# Patient Record
Sex: Female | Born: 1940
Health system: Southern US, Community
[De-identification: ages and names within clinical notes are randomized; demographics above are authoritative.]

## PROBLEM LIST (undated history)

## (undated) DIAGNOSIS — E876 Hypokalemia: Secondary | ICD-10-CM

## (undated) DIAGNOSIS — E785 Hyperlipidemia, unspecified: Secondary | ICD-10-CM

## (undated) DIAGNOSIS — M810 Age-related osteoporosis without current pathological fracture: Secondary | ICD-10-CM

## (undated) DIAGNOSIS — E039 Hypothyroidism, unspecified: Secondary | ICD-10-CM

## (undated) DIAGNOSIS — K279 Peptic ulcer, site unspecified, unspecified as acute or chronic, without hemorrhage or perforation: Secondary | ICD-10-CM

## (undated) DIAGNOSIS — J449 Chronic obstructive pulmonary disease, unspecified: Secondary | ICD-10-CM

## (undated) DIAGNOSIS — K219 Gastro-esophageal reflux disease without esophagitis: Secondary | ICD-10-CM

## (undated) DIAGNOSIS — J309 Allergic rhinitis, unspecified: Secondary | ICD-10-CM

## (undated) DIAGNOSIS — C50919 Malignant neoplasm of unspecified site of unspecified female breast: Secondary | ICD-10-CM

## (undated) DIAGNOSIS — I10 Essential (primary) hypertension: Secondary | ICD-10-CM

## (undated) DIAGNOSIS — I739 Peripheral vascular disease, unspecified: Secondary | ICD-10-CM

## (undated) HISTORY — DX: Essential (primary) hypertension: I10

## (undated) HISTORY — PX: ROTATOR CUFF REPAIR: SHX139

## (undated) HISTORY — DX: Hyperlipidemia, unspecified: E78.5

## (undated) HISTORY — DX: Hypokalemia: E87.6

## (undated) HISTORY — PX: EYE SURGERY: SHX253

## (undated) HISTORY — DX: Allergic rhinitis, unspecified: J30.9

## (undated) HISTORY — DX: Hypothyroidism, unspecified: E03.9

## (undated) HISTORY — PX: OTHER SURGICAL HISTORY: SHX169

## (undated) HISTORY — DX: Peripheral vascular disease, unspecified: I73.9

## (undated) HISTORY — PX: BREAST LUMPECTOMY: SHX2

## (undated) HISTORY — DX: Peptic ulcer, site unspecified, unspecified as acute or chronic, without hemorrhage or perforation: K27.9

## (undated) HISTORY — DX: Age-related osteoporosis without current pathological fracture: M81.0

## (undated) HISTORY — DX: Gastro-esophageal reflux disease without esophagitis: K21.9

## (undated) HISTORY — DX: Chronic obstructive pulmonary disease, unspecified: J44.9

## (undated) HISTORY — DX: Malignant neoplasm of unspecified site of unspecified female breast: C50.919

---

## 2012-02-19 ENCOUNTER — Encounter: Payer: Self-pay | Admitting: Critical Care Medicine

## 2012-02-20 ENCOUNTER — Ambulatory Visit (INDEPENDENT_AMBULATORY_CARE_PROVIDER_SITE_OTHER): Payer: Medicare FFS | Admitting: Critical Care Medicine

## 2012-02-20 ENCOUNTER — Encounter: Payer: Self-pay | Admitting: Critical Care Medicine

## 2012-02-20 VITALS — BP 102/58 | HR 78 | Temp 100.1°F | Ht 64.0 in | Wt 117.0 lb

## 2012-02-20 DIAGNOSIS — K219 Gastro-esophageal reflux disease without esophagitis: Secondary | ICD-10-CM | POA: Insufficient documentation

## 2012-02-20 DIAGNOSIS — M810 Age-related osteoporosis without current pathological fracture: Secondary | ICD-10-CM | POA: Insufficient documentation

## 2012-02-20 DIAGNOSIS — C50919 Malignant neoplasm of unspecified site of unspecified female breast: Secondary | ICD-10-CM | POA: Insufficient documentation

## 2012-02-20 DIAGNOSIS — K279 Peptic ulcer, site unspecified, unspecified as acute or chronic, without hemorrhage or perforation: Secondary | ICD-10-CM | POA: Insufficient documentation

## 2012-02-20 DIAGNOSIS — C3490 Malignant neoplasm of unspecified part of unspecified bronchus or lung: Secondary | ICD-10-CM | POA: Insufficient documentation

## 2012-02-20 DIAGNOSIS — R918 Other nonspecific abnormal finding of lung field: Secondary | ICD-10-CM

## 2012-02-20 DIAGNOSIS — R222 Localized swelling, mass and lump, trunk: Secondary | ICD-10-CM

## 2012-02-20 DIAGNOSIS — I739 Peripheral vascular disease, unspecified: Secondary | ICD-10-CM | POA: Insufficient documentation

## 2012-02-20 DIAGNOSIS — I10 Essential (primary) hypertension: Secondary | ICD-10-CM | POA: Insufficient documentation

## 2012-02-20 DIAGNOSIS — J449 Chronic obstructive pulmonary disease, unspecified: Secondary | ICD-10-CM | POA: Insufficient documentation

## 2012-02-20 MED ORDER — TRAMADOL HCL 50 MG PO TABS: ORAL_TABLET | ORAL | Status: DC

## 2012-02-20 NOTE — Progress Notes (Signed)
Subjective:    Patient ID: Molly Middleton, female    DOB: June 29, 1940, 71 y.o.   MRN: [id number]  HPI 71 y.o. WF. This patient is referred for evaluation a right middle lobe lung mass. Patient has noted weight loss loss of appetite and aching all over. Symptoms a progress and she underwent imaging studies showing a right middle lobe lung mass with mediastinal adenopathy. The patient referred for further evaluation. There is no fever. No chills. There is no hemoptysis. Patient's lifelong smoker. She's been diagnosis COPD in the past. Patient referred for further evaluation.  Past Medical History  Diagnosis Date  . Hyperlipemia   . Hypothyroid   . COPD (chronic obstructive pulmonary disease)   . GERD (gastroesophageal reflux disease)   . Peptic ulcer disease   . Hypertension   . Osteoporosis   . Allergic rhinitis   . PVD (peripheral vascular disease)   . Hypokalemia   . Breast cancer      Family History  Problem Relation Age of Onset  . Heart disease Father   . Stroke Mother      History   Social History  . Marital Status: Widowed    Spouse Name: N/A    Number of Children: N/A  . Years of Education: N/A   Occupational History  . Not on file.   Social History Main Topics  . Smoking status: Current Every Day Smoker    Types: Cigarettes  . Smokeless tobacco: Not on file     Comment: Currently smoking 2-3 cig per day.  . Alcohol Use: Not on file  . Drug Use: Not on file  . Sexually Active: Not on file   Other Topics Concern  . Not on file   Social History Narrative  . No narrative on file     Allergies  Allergen Reactions  . Advil (Ibuprofen)   . Asa (Aspirin)      Outpatient Prescriptions Prior to Visit  Medication Sig Dispense Refill  . albuterol (PROVENTIL HFA;VENTOLIN HFA) 108 (90 BASE) MCG/ACT inhaler Inhale 2 puffs into the lungs every 6 (six) hours as needed.      Marland Kitchen amcinonide (CYCLOCORT) 0.1 % ointment Apply topically 2 (two) times daily.      Marland Kitchen  aspirin EC 81 MG tablet Take 81 mg by mouth daily.      Marland Kitchen atenolol-chlorthalidone (TENORETIC) 50-25 MG per tablet Take 1 tablet by mouth daily.      . Calcium Carbonate (CALTRATE 600 PO) Take 1 tablet by mouth 2 (two) times daily.       . diphenhydramine-acetaminophen (TYLENOL PM) 25-500 MG TABS Take 1 tablet by mouth at bedtime.       . mometasone (NASONEX) 50 MCG/ACT nasal spray Place 2 sprays into the nose daily.      . niacin (NIASPAN) 1000 MG CR tablet Take 1,000 mg by mouth at bedtime.      Marland Kitchen omeprazole (PRILOSEC) 20 MG capsule Take 20 mg by mouth daily.      . raloxifene (EVISTA) 60 MG tablet Take 60 mg by mouth daily.      Marland Kitchen tiotropium (SPIRIVA) 18 MCG inhalation capsule Place 18 mcg into inhaler and inhale daily.      . [DISCONTINUED] potassium chloride (K-DUR,KLOR-CON) 10 MEQ tablet Take 10 mEq by mouth 2 (two) times daily.      . [DISCONTINUED] levothyroxine (SYNTHROID, LEVOTHROID) 75 MCG tablet Take 75 mcg by mouth daily.      . [DISCONTINUED] levothyroxine (SYNTHROID, LEVOTHROID)  88 MCG tablet Take 88 mcg by mouth daily.      . [DISCONTINUED] mupirocin ointment (BACTROBAN) 2 % Apply topically 3 (three) times daily as needed.       . [DISCONTINUED] simvastatin (ZOCOR) 80 MG tablet Take 80 mg by mouth at bedtime.       Last reviewed on 02/20/2012  3:56 PM by Storm Frisk, MD    Review of Systems Constitutional:   +++  weight loss,+++ night sweats, ++ Fevers, chills,++ fatigue, ++lassitude. HEENT:   No headaches,  Difficulty swallowing,  Tooth/dental problems,  Sore throat,                No sneezing, itching, ear ache, nasal congestion, post nasal drip,   CV:  +++ chest pain,  Orthopnea, PND, ++ swelling in lower extremities, no anasarca, dizziness, palpitations  GI  No heartburn, indigestion, abdominal pain, nausea, vomiting, diarrhea, change in bowel habits, loss of appetite  Resp: ++ shortness of breath with exertion ++ at rest.  No excess mucus, ++ productive cough,  ++  non-productive cough,  No coughing up of blood.  No change in color of mucus.  No wheezing.  No chest wall deformity  Skin: no rash or lesions.  GU: no dysuria, change in color of urine, no urgency or frequency.  No flank pain.  MS:  ++ joint pain no swelling.  No decreased range of motion.  No back pain.  Psych:  No change in mood or affect. No depression or anxiety.  No memory loss.     Objective:   Physical Exam Filed Vitals:   02/20/12 1541  BP: 102/58  Pulse: 78  Temp: 100.1 F (37.8 C)  Height: 5\' 4"  (1.626 m)  Weight: 117 lb (53.071 kg)  SpO2: 98%    Gen: thin cachectic WF, in no distress,  normal affect  ENT: No lesions,  mouth clear,  oropharynx clear, no postnasal drip  Neck: No JVD, no TMG, no carotid bruits  Lungs: No use of accessory muscles, no dullness to percussion,distant BS  Cardiovascular: RRR, heart sounds normal, no murmur or gallops, no peripheral edema  Abdomen: soft and NT, no HSM,  BS normal  Musculoskeletal: No deformities, no cyanosis or clubbing  Neuro: alert, non focal  Skin: Warm, no lesions or rashes    CT scan of the chest is reviewed and shows a 7 cm mass in the right middle lobe with associated mediastinal and hilar adenopathy      Assessment & Plan:   Lung mass Right middle lobe lung mass 7 cm in diameter with associated hilar and mediastinal lymphadenopathy. Diffuse bone pain therefore need to rule out bone metastases. This is malignancy until proven otherwise. Plan Needle biopsy will be obtained of the lung mass PET scan will be obtained to evaluate metastatic disease Oncology referral will be made to the [facility] cancer clinic after results are obtained from needle biopsy   Updated Medication List Outpatient Encounter Prescriptions as of 02/20/2012  Medication Sig Dispense Refill  . albuterol (PROVENTIL HFA;VENTOLIN HFA) 108 (90 BASE) MCG/ACT inhaler Inhale 2 puffs into the lungs every 6 (six) hours as needed.      Marland Kitchen  amcinonide (CYCLOCORT) 0.1 % ointment Apply topically 2 (two) times daily.      Marland Kitchen aspirin EC 81 MG tablet Take 81 mg by mouth daily.      Marland Kitchen atenolol-chlorthalidone (TENORETIC) 50-25 MG per tablet Take 1 tablet by mouth daily.      Marland Kitchen  Calcium Carbonate (CALTRATE 600 PO) Take 1 tablet by mouth 2 (two) times daily.       . cyanocobalamin 100 MCG tablet Take 100 mcg by mouth daily.      . diphenhydramine-acetaminophen (TYLENOL PM) 25-500 MG TABS Take 1 tablet by mouth at bedtime.       Marland Kitchen levofloxacin (LEVAQUIN) 750 MG tablet Take 1 tablet by mouth daily.      Marland Kitchen levothyroxine (SYNTHROID, LEVOTHROID) 25 MCG tablet Take by mouth daily. UNSURE OF STRENGTH      . mometasone (NASONEX) 50 MCG/ACT nasal spray Place 2 sprays into the nose daily.      . niacin (NIASPAN) 1000 MG CR tablet Take 1,000 mg by mouth at bedtime.      Marland Kitchen omeprazole (PRILOSEC) 20 MG capsule Take 20 mg by mouth daily.      . potassium chloride (MICRO-K) 10 MEQ CR capsule Take 1 tablet by mouth Twice daily.      . raloxifene (EVISTA) 60 MG tablet Take 60 mg by mouth daily.      Marland Kitchen tiotropium (SPIRIVA) 18 MCG inhalation capsule Place 18 mcg into inhaler and inhale daily.      . [DISCONTINUED] potassium chloride (K-DUR,KLOR-CON) 10 MEQ tablet Take 10 mEq by mouth 2 (two) times daily.      . traMADol (ULTRAM) 50 MG tablet 1-2 every 4 hours as needed for pain  60 tablet  4  . [DISCONTINUED] levothyroxine (SYNTHROID, LEVOTHROID) 75 MCG tablet Take 75 mcg by mouth daily.      . [DISCONTINUED] levothyroxine (SYNTHROID, LEVOTHROID) 88 MCG tablet Take 88 mcg by mouth daily.      . [DISCONTINUED] mupirocin ointment (BACTROBAN) 2 % Apply topically 3 (three) times daily as needed.       . [DISCONTINUED] simvastatin (ZOCOR) 80 MG tablet Take 80 mg by mouth at bedtime.

## 2012-02-20 NOTE — Patient Instructions (Addendum)
A needle biopsy of the lung will be obtained A PET scan will be obtained I will call with results Lab today to check on coagulation status for the biopsy Tramadol for pain

## 2012-02-21 ENCOUNTER — Telehealth: Payer: Self-pay | Admitting: Critical Care Medicine

## 2012-02-21 LAB — PROTIME-INR: INR: 1.38 (ref <1.50)

## 2012-02-21 NOTE — Assessment & Plan Note (Signed)
Right middle lobe lung mass 7 cm in diameter with associated hilar and mediastinal lymphadenopathy. Diffuse bone pain therefore need to rule out bone metastases. This is malignancy until proven otherwise. Plan Needle biopsy will be obtained of the lung mass PET scan will be obtained to evaluate metastatic disease Oncology referral will be made to the [facility] cancer clinic after results are obtained from needle biopsy

## 2012-02-21 NOTE — Telephone Encounter (Signed)
Called and spoke with pts daughter and she stated she had a few questions for PW.  She has questions about the biopsy and the PET scan and she has declined in the last couple of weeks---her legs are hurting so bad she can hardly walk and not able to get up and move around much.  Would like to speak with PW if at all possible.  Thanks  Allergies  Allergen Reactions  . Advil (Ibuprofen)   . Asa (Aspirin)

## 2012-02-21 NOTE — Telephone Encounter (Signed)
No answer when I call back

## 2012-02-24 ENCOUNTER — Encounter (HOSPITAL_COMMUNITY): Payer: Self-pay | Admitting: Pharmacy Technician

## 2012-02-24 NOTE — Telephone Encounter (Signed)
The pt;s daughter would still like to speak with PW if possible. She wants to know 1) if the PET scan shows cancer throughout the pt's body, will the other procedures still be necessary and 2) if needle biopsy is done will this spread the cancer that is there? Pls advise.

## 2012-02-24 NOTE — Telephone Encounter (Signed)
i answered her questions.

## 2012-02-25 ENCOUNTER — Other Ambulatory Visit: Payer: Self-pay | Admitting: Radiology

## 2012-02-26 ENCOUNTER — Encounter (HOSPITAL_COMMUNITY)
Admission: RE | Admit: 2012-02-26 | Discharge: 2012-02-26 | Disposition: A | Discharge status: Home / Self Care | Payer: Medicare FFS | Source: Ambulatory Visit | Attending: Critical Care Medicine | Admitting: Critical Care Medicine

## 2012-02-26 ENCOUNTER — Telehealth: Payer: Self-pay | Admitting: Critical Care Medicine

## 2012-02-26 DIAGNOSIS — R222 Localized swelling, mass and lump, trunk: Secondary | ICD-10-CM | POA: Insufficient documentation

## 2012-02-26 DIAGNOSIS — R918 Other nonspecific abnormal finding of lung field: Secondary | ICD-10-CM

## 2012-02-26 LAB — GLUCOSE, CAPILLARY: Glucose-Capillary: 120 mg/dL — ABNORMAL HIGH (ref 70–99)

## 2012-02-26 NOTE — Telephone Encounter (Signed)
I spoke with the pt daughter and she states the PET scan machine broke today so they did not have the PET scan done, it was rescheduled to 03/03/12. She also states the pt is saying that the biopsy set for tomorrow is cancelled as well. They wants [place] to check on this. Also they want to know if there is anyway to have the PET scan sooner then 03-03-12. They want to go to another location if [facility] does not have a sooner appt. I called [facility] radiology and they states the machine is broken for PET scan. They state that the biopsy is still scheduled for 02-27-12. Dr. Delford Field did you need the PET scan to be done before the biopsy? If not is it ok to schedule the PET at another location in order for the pt to have it done sooner? Please advise. Carron Curie, CMA

## 2012-02-26 NOTE — Telephone Encounter (Signed)
Pt's daughter called back again re: same. Would like to hear from nurse asap in the next 30 minutes. Hazel Sams

## 2012-02-26 NOTE — Telephone Encounter (Signed)
I spoke with Dr. Delford Field and he states to keep biopsy appt and to keep appt for PET on 03-03-12. He states that having the PET on 12-17-1 will not delay any care.  I spoke with the pt daughter and advised.  Carron Curie, CMA

## 2012-02-27 ENCOUNTER — Ambulatory Visit (HOSPITAL_COMMUNITY)
Admission: RE | Admit: 2012-02-27 | Discharge: 2012-02-27 | Disposition: A | Discharge status: Home / Self Care | Payer: Medicare FFS | Source: Ambulatory Visit | Attending: Critical Care Medicine | Admitting: Critical Care Medicine

## 2012-02-27 ENCOUNTER — Telehealth: Payer: Self-pay | Admitting: Critical Care Medicine

## 2012-02-27 ENCOUNTER — Encounter (HOSPITAL_COMMUNITY): Payer: Self-pay

## 2012-02-27 ENCOUNTER — Other Ambulatory Visit: Payer: Self-pay | Admitting: Radiology

## 2012-02-27 DIAGNOSIS — R222 Localized swelling, mass and lump, trunk: Secondary | ICD-10-CM | POA: Insufficient documentation

## 2012-02-27 DIAGNOSIS — C801 Malignant (primary) neoplasm, unspecified: Secondary | ICD-10-CM | POA: Insufficient documentation

## 2012-02-27 DIAGNOSIS — C7800 Secondary malignant neoplasm of unspecified lung: Secondary | ICD-10-CM | POA: Insufficient documentation

## 2012-02-27 DIAGNOSIS — R918 Other nonspecific abnormal finding of lung field: Secondary | ICD-10-CM

## 2012-02-27 LAB — CBC
Platelets: 395 10*3/uL (ref 150–400)
RBC: 3.68 MIL/uL — ABNORMAL LOW (ref 3.87–5.11)
WBC: 11.2 10*3/uL — ABNORMAL HIGH (ref 4.0–10.5)

## 2012-02-27 LAB — APTT: aPTT: 38 seconds — ABNORMAL HIGH (ref 24–37)

## 2012-02-27 LAB — PROTIME-INR: Prothrombin Time: 15.1 seconds (ref 11.6–15.2)

## 2012-02-27 MED ORDER — SODIUM CHLORIDE 0.9 % IV SOLN
INTRAVENOUS | Status: DC
  Administered 2012-02-27: 08:00:00 via INTRAVENOUS

## 2012-02-27 MED ORDER — MIDAZOLAM HCL 2 MG/2ML IJ SOLN
INTRAMUSCULAR | Status: AC | PRN
  Administered 2012-02-27 (×2): 0.5 mg via INTRAVENOUS

## 2012-02-27 MED ORDER — HYDROCODONE-ACETAMINOPHEN 5-325 MG PO TABS
1.0000 | ORAL_TABLET | ORAL | Status: DC | PRN
  Filled 2012-02-27: qty 2

## 2012-02-27 MED ORDER — FENTANYL CITRATE 0.05 MG/ML IJ SOLN
INTRAMUSCULAR | Status: AC
  Filled 2012-02-27: qty 6

## 2012-02-27 MED ORDER — FENTANYL CITRATE 0.05 MG/ML IJ SOLN
INTRAMUSCULAR | Status: AC | PRN
  Administered 2012-02-27: 50 ug via INTRAVENOUS

## 2012-02-27 MED ORDER — MIDAZOLAM HCL 2 MG/2ML IJ SOLN
INTRAMUSCULAR | Status: AC
  Filled 2012-02-27: qty 6

## 2012-02-27 NOTE — Telephone Encounter (Signed)
i did not call this pt.  i will call her when bx results available

## 2012-02-27 NOTE — H&P (Signed)
Chief Complaint: "I'm here for a biopsy" Referring Physician:Wright HPI: Molly Middleton is an 71 y.o. female with newly found right middle lobe lung mass. She is referred for biopsy. PMHx and meds reviewed. Pt denies recent illness, fever, UTI, diarrhea.   Past Medical History:  Past Medical History  Diagnosis Date  . Hyperlipemia   . Hypothyroid   . COPD (chronic obstructive pulmonary disease)   . GERD (gastroesophageal reflux disease)   . Peptic ulcer disease   . Hypertension   . Osteoporosis   . Allergic rhinitis   . PVD (peripheral vascular disease)   . Hypokalemia   . Breast cancer     Past Surgical History:  Past Surgical History  Procedure Date  . Breast lumpectomy   . Tubal lig   . Rotator cuff repair     Family History:  Family History  Problem Relation Age of Onset  . Heart disease Father   . Stroke Mother     Social History:  reports that she quit smoking 12 days ago. Her smoking use included Cigarettes. She smoked 1 pack per day. She has never used smokeless tobacco. She reports that she does not drink alcohol or use illicit drugs.  Allergies:  Allergies  Allergen Reactions  . Advil (Ibuprofen)     Medications: albuterol (PROVENTIL HFA;VENTOLIN HFA) 108 (90 BASE) MCG/ACT inhaler (Taking) Sig - Route: Inhale 2 puffs into the lungs every 4 (four) hours as needed. For shortness of breath - Inhalation Class: Historical Med Number of times this order has been changed since signing: 3 [address] amcinonide (CYCLOCORT) 0.1 % ointment (Taking) Sig - Route: Apply topically 2 (two) times daily. - Topical Class: Historical Med Number of times this order has been changed since signing: 1 [address] aspirin EC 81 MG tablet (Taking) Sig - Route: Take 81 mg by mouth daily. - Oral Class: Historical Med Number of times this order has been changed since signing: 1 [address] atenolol-chlorthalidone (TENORETIC) 50-25 MG per tablet (Taking) Sig - Route:  Take 1 tablet by mouth every morning. - Oral Class: Historical Med Number of times this order has been changed since signing: 3 [address] levofloxacin (LEVAQUIN) 750 MG tablet (Taking) 02/18/2012 Sig - Route: Take 1 tablet by mouth daily. - Oral Class: Historical Med Number of times this order has been changed since signing: 1 [address] mometasone (NASONEX) 50 MCG/ACT nasal spray (Taking) Sig - Route: Place 2 sprays into the nose daily. - Nasal Class: Historical Med Number of times this order has been changed since signing: 1 [address] niacin (NIASPAN) 1000 MG CR tablet (Taking) Sig - Route: Take 1,000 mg by mouth at bedtime. - Oral Class: Historical Med Number of times this order has been changed since signing: 1 [address] omeprazole (PRILOSEC) 20 MG capsule (Taking) Sig - Route: Take 20 mg by mouth daily. - Oral Class: Historical Med Number of times this order has been changed since signing: 1 [address] raloxifene (EVISTA) 60 MG tablet (Taking) Sig - Route: Take 60 mg by mouth every morning. - Oral Class: Historical Med Number of times this order has been changed since signing: 3 Order [address] tiotropium (SPIRIVA) 18 MCG inhalation capsule (Taking) Sig - Route: Place 18 mcg into inhaler and inhale daily. - Inhalation   Please HPI for pertinent positives, otherwise complete 10 system ROS negative.  Physical Exam: Blood pressure 112/64, pulse 59, temperature 98.2 F (36.8 C),  temperature source Oral, resp. rate 18, height 5\' 4"  (1.626 m), weight 116 lb (52.617 kg), SpO2 99.00%. Body mass index is 19.91 kg/(m^2).   General Appearance:  Alert, cooperative, no distress, appears stated age  Head:  Normocephalic, without obvious abnormality, atraumatic  ENT: Unremarkable  Neck: Supple, symmetrical, trachea midline, no adenopathy, thyroid: not enlarged, symmetric, no tenderness/mass/nodules  Lungs:   Clear to auscultation bilaterally, no w/r/r, respirations  unlabored without use of accessory muscles.  Heart:  Regular rate and rhythm, S1, S2 normal, no murmur, rub or gallop. Carotids 2+ without bruit.  Abdomen:   Soft, non-tender, non distended. Bowel sounds active all four quadrants,  no masses, no organomegaly.  Neurologic: Normal affect, no gross deficits.   Results for orders placed during the hospital encounter of 02/27/12 (from the past 48 hour(s))  APTT     Status: Abnormal   Collection Time   02/27/12  7:50 AM      Component Value Range Comment   aPTT 38 (*) 24 - 37 seconds   CBC     Status: Abnormal   Collection Time   02/27/12  7:50 AM      Component Value Range Comment   WBC 11.2 (*) 4.0 - 10.5 K/uL    RBC 3.68 (*) 3.87 - 5.11 MIL/uL    Hemoglobin 10.8 (*) 12.0 - 15.0 g/dL    HCT 47.8 (*) 29.5 - 46.0 %    MCV 87.2  78.0 - 100.0 fL    MCH 29.3  26.0 - 34.0 pg    MCHC 33.6  30.0 - 36.0 g/dL    RDW 62.1  30.8 - 65.7 %    Platelets 395  150 - 400 K/uL   PROTIME-INR     Status: Normal   Collection Time   02/27/12  7:50 AM      Component Value Range Comment   Prothrombin Time 15.1  11.6 - 15.2 seconds    INR 1.21  0.00 - 1.49    No results found.  Assessment/Plan Right lung mass Discussed CT guided perc biopsy including risks and complications Labs reviewed, ok Consent signed in chart  Brayton El PA-C 02/27/2012, 8:36 AM

## 2012-02-27 NOTE — Telephone Encounter (Signed)
Dr. Delford Field or Crys, did one of you call the pt? She is calling back but I do not see any results or recs needed to be given Please advise or call her back thanks!!

## 2012-02-27 NOTE — Procedures (Signed)
CT lung core bx 18g x3 No ptx. No complication No blood loss. See complete dictation in [facility].

## 2012-02-27 NOTE — Telephone Encounter (Signed)
Pt has PET scheduled at [facility] 03/03/12 12:30 pt to arrive at 12:15. i called and made sara aware of this and needed nothing further

## 2012-02-28 NOTE — Telephone Encounter (Signed)
Called spoke with pt's daughter Velna Hatchet and advised no one from this office called her.  Velna Hatchet is okay with this and stated that the caller had left a message at her parents' home but she has yet been able to listen to the message and stated that perhaps it was the hospital as pt had a biopsy scheduled yesterday.  Nothing further needed at this time; will sign off.

## 2012-02-28 NOTE — Telephone Encounter (Signed)
I did not call pt either.

## 2012-02-29 ENCOUNTER — Telehealth: Payer: Self-pay | Admitting: Critical Care Medicine

## 2012-02-29 DIAGNOSIS — C3490 Malignant neoplasm of unspecified part of unspecified bronchus or lung: Secondary | ICD-10-CM

## 2012-02-29 NOTE — Telephone Encounter (Signed)
fna pos for adenocarcinoma. Plan referral to oncology at [facility] notified of results

## 2012-03-02 NOTE — Telephone Encounter (Signed)
Referral and records faxed to [facility] requesting appt this week Molly Middleton

## 2012-03-03 ENCOUNTER — Encounter (HOSPITAL_COMMUNITY)
Admission: RE | Admit: 2012-03-03 | Discharge: 2012-03-03 | Disposition: A | Discharge status: Home / Self Care | Payer: Medicare FFS | Source: Ambulatory Visit | Attending: Critical Care Medicine | Admitting: Critical Care Medicine

## 2012-03-03 DIAGNOSIS — J90 Pleural effusion, not elsewhere classified: Secondary | ICD-10-CM | POA: Insufficient documentation

## 2012-03-03 DIAGNOSIS — I2510 Atherosclerotic heart disease of native coronary artery without angina pectoris: Secondary | ICD-10-CM | POA: Insufficient documentation

## 2012-03-03 DIAGNOSIS — R222 Localized swelling, mass and lump, trunk: Secondary | ICD-10-CM | POA: Insufficient documentation

## 2012-03-03 DIAGNOSIS — N200 Calculus of kidney: Secondary | ICD-10-CM | POA: Insufficient documentation

## 2012-03-03 LAB — GLUCOSE, CAPILLARY: Glucose-Capillary: 108 mg/dL — ABNORMAL HIGH (ref 70–99)

## 2012-03-03 MED ORDER — FLUDEOXYGLUCOSE F - 18 (FDG) INJECTION
20.0000 | Freq: Once | INTRAVENOUS | Status: AC | PRN
  Administered 2012-03-03: 20 via INTRAVENOUS

## 2012-03-04 NOTE — Progress Notes (Signed)
Quick Note:  Spoke with [employer]. She spoke with [facility] this am. Pt is schedule to see see Dr. Melvyn Neth on Monday, Sept 23 -- pt aware per Almyra Free. Dr. Delford Field aware. ______

## 2012-04-24 ENCOUNTER — Telehealth: Payer: Self-pay | Admitting: Radiation Oncology

## 2012-04-24 NOTE — Telephone Encounter (Signed)
Faxed old microfilm records (old MR [id number]) to Dr. Clovis Riley per [facility].

## 2012-04-27 ENCOUNTER — Telehealth: Payer: Self-pay | Admitting: Radiation Oncology

## 2012-04-27 NOTE — Telephone Encounter (Signed)
Dr. Dayton Scrape is taking old films down to Dr. Clovis Riley.  Scanned into [facility].

## 2014-04-25 ENCOUNTER — Ambulatory Visit (INDEPENDENT_AMBULATORY_CARE_PROVIDER_SITE_OTHER): Payer: 59

## 2014-04-25 VITALS — BP 145/77 | HR 64 | Resp 18

## 2014-04-25 DIAGNOSIS — L84 Corns and callosities: Secondary | ICD-10-CM

## 2014-04-25 DIAGNOSIS — M2040 Other hammer toe(s) (acquired), unspecified foot: Secondary | ICD-10-CM

## 2014-04-25 DIAGNOSIS — Q828 Other specified congenital malformations of skin: Secondary | ICD-10-CM

## 2014-04-25 NOTE — Progress Notes (Signed)
   Subjective:    Patient ID: Molly Middleton, female    DOB: 05-20-1940, 74 y.o.   MRN: [id number]  HPI I HAVE A CORN ON MY 5TH TOE ON MY RIGHT FOOT AND HAS BEEN THERE SINCE I WAS 29 AND I HAVE USED PADS AND IS SORE AND TENDER AND CAN'T WALK AND HURTS WITH SHOES AND I GO BAREFOOT AND I HAD LUNG CANCER ABOUT 3 YEARS AGO    Review of Systems  HENT: Positive for hearing loss.   Respiratory: Positive for shortness of breath.        DIFFICULTY BREATHING  Hematological: Bruises/bleeds easily.  All other systems reviewed and are negative.      Objective:   Physical Exam 74 year old white female well-developed well-nourished oriented 3 presents this time with painful keratotic lesion proxy 2-3 mm in diameter medial fifth digit right foot over the proximal IP joint distal IP joint. Well-circumscribed lesion she's been treating with topical salicylic acid or corn pads. Lower extremity objective findings reveal vascular status to be intact although diminished dorsalis pedis nonpalpable PT thready at one over 4 bilateral patient is been told she has poor circulation the past. Epicritic sensations intact bilateral there is normal plantar response DTRs not elicited dermatologic the skin color pigment normal hair growth absent mild varicosities noted skin temperature cool. Turgor diminished. No edema noted neurologically and proprioceptive sensations intact there is normal plantar response DTRs not elicited dermatologically skin color pigment normal hair growth absent there is keratotic lesion medial fifth toe right over the distal IP joint painful on direct lateral compression consistent with a porokeratosis or painful corn over the IP joint medially. No open wounds no ulcers no secondary infections noted mild hammertoe deformity with some rigid digital contractures noted.      Assessment & Plan:  Assessment porokeratosis/corn callus medial fifth toe right foot over the IP joint consistent with a painful  porokeratosis due to hammertoe deformity digital contracture cannot rule out mild exostosis no x-rays taken at this time keratotic lesions debrided Neosporin and to foam padding and dispensed some dispersed or tube foam pads are given to the patient to cushion and separated the toes to allow for healing a keratosis if not resolved within a month or more so follow-up in the future as needed for palliative care again lesion is debrided no signs of infection no discharge drainage slight erythema due to irritation possible salicylic acid burn next  Harriet Masson DPM

## 2014-06-20 IMAGING — PT NM PET TUM IMG INITIAL (PI) SKULL BASE T - THIGH
6 series · 25 of 25 positions shown · non-contrast
Comparison: 02/14/12

CLINICAL DATA: Initial treatment strategy for lung mass.

NUCLEAR MEDICINE PET SKULL BASE TO THIGH
Fasting Blood Glucose:  108
TECHNIQUE: 20 mCi F-18 FDG was injected intravenously. CT data was
obtained and used for attenuation correction and anatomic
localization only.  (This was not acquired as a diagnostic CT
examination.) Additional exam technical data entered on
technologist worksheet.

[Series 1: pet ac · axial · 3.3mm · 4.69mm/px · z∈[-869,+1]mm · 5 of 267 slices shown]
[im 1/267]
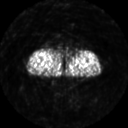
[im 67/267]
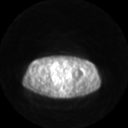
[im 134/267]
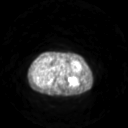
[im 200/267]
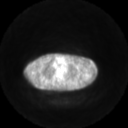
[im 267/267]
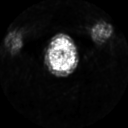

[Series 2: ct images · axial · 3.8mm · 0.98mm/px · z∈[-869,+0]mm · 6 of 267 slices shown]
[im 1/267]
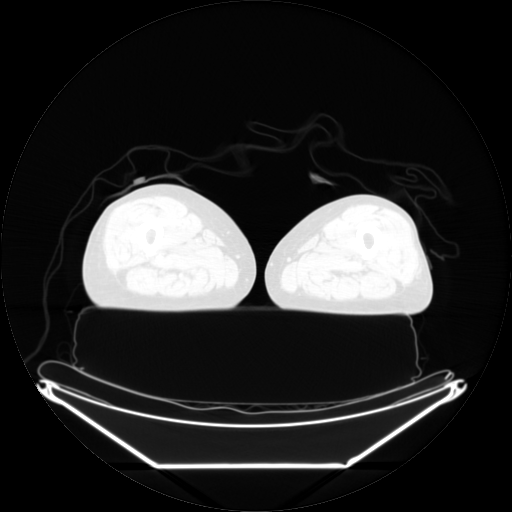
[im 54/267]
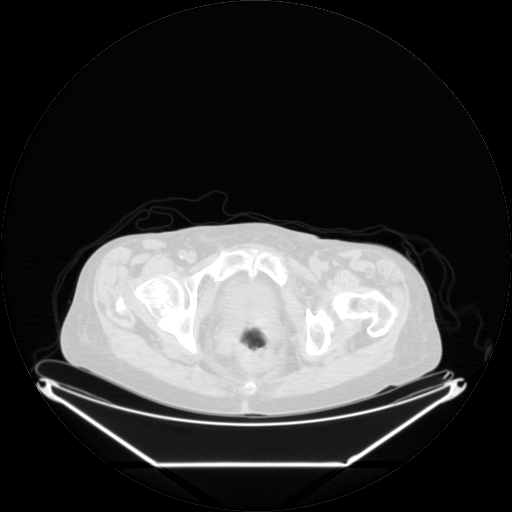
[im 107/267]
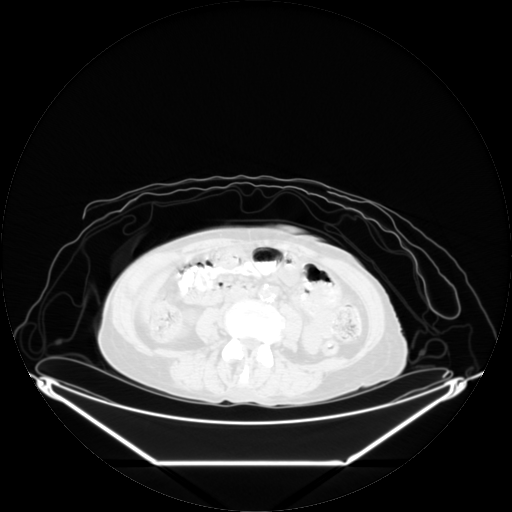
[im 160/267]
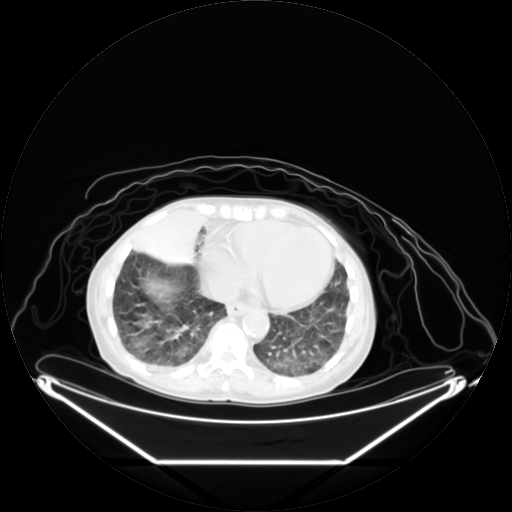
[im 213/267]
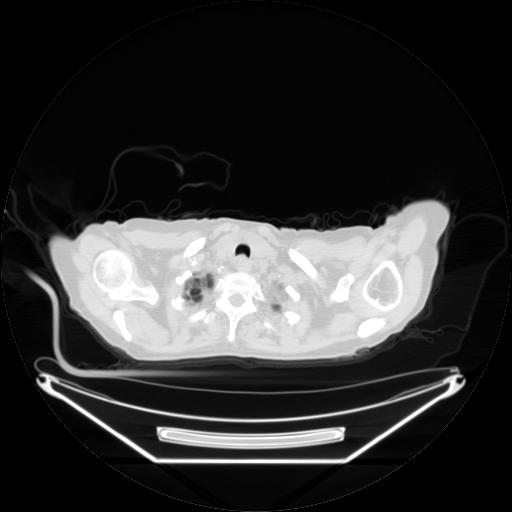
[im 267/267  brain]
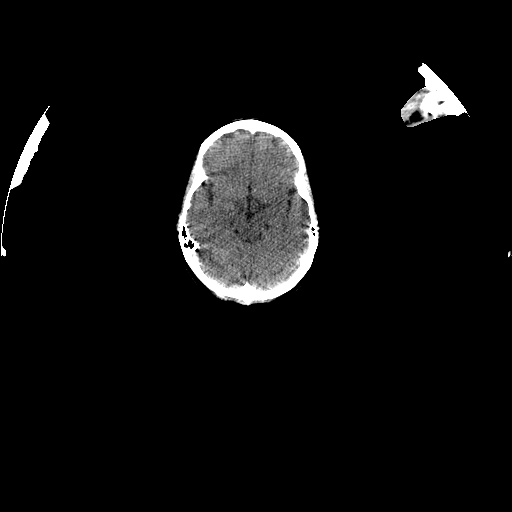

[Series 2: pet nac · axial · 3.3mm · 4.69mm/px · z∈[-869,+1]mm · 6 of 267 slices shown]
[im 1/267]
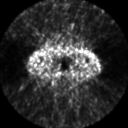
[im 54/267]
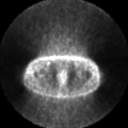
[im 107/267]
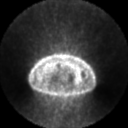
[im 160/267]
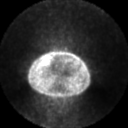
[im 213/267]
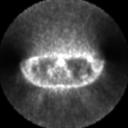
[im 267/267]
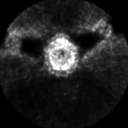

[Series 123: mip · coronal · 3.3mm · 4.69mm/px · 1 of 30 slices shown]
[im 1/30]
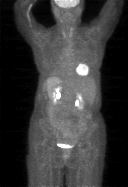

[Series 151: reformatted · axial · 3.3mm · 3.91mm/px · z∈[-869,+1]mm · 6 of 265 slices shown (1 of 2)]
[im 1/265]
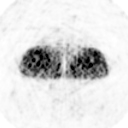
[im 53/265]
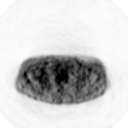
[im 106/265]
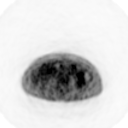
[im 159/265]
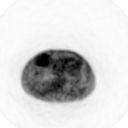
[im 212/265]
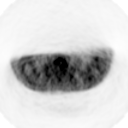
[im 265/265]
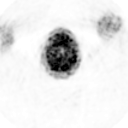

[Series 153: reformatted · coronal · 4.7mm · 6.98mm/px · 1 of 65 slices shown (2 of 2)]
[im 1/65]
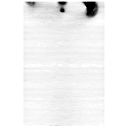

[25 of 25 positions shown; findings below may reference images not displayed]

FINDINGS: Neck: No hypermetabolic lymph nodes within the soft tissues of the
neck.

Chest:  Right middle lobe mass measures 5.6 x 7.2 x 4.9 cm.  The
SUV max associated this mass is equal to 13.7, image 104.  There is
a small right effusion.  There are no enlarged or hypermetabolic
mediastinal or hilar lymph nodes.  Prominent calcifications
involving the LAD, left circumflex and RCA coronary artery noted.
Heart size is normal.  No pericardial effusion.

Abdomen/Pelvis:  No abnormal hypermetabolic activity within the
liver, pancreas, adrenal glands, or spleen.  No hypermetabolic
lymph nodes in the abdomen or pelvis. Calcifications within the
pancreas and spleen noted.  There is a nonobstructing right renal
calculus.  Calcified atherosclerotic disease affects the abdominal
aorta which measures up to 1.7 cm.  Low attenuation nodularity
involving the adrenal glands noted without significant FDG uptake.
Likely reflecting adenomas.

Skeleton:  No focal hypermetabolic activity to suggest skeletal
metastasis.
IMPRESSION: 1.  Right middle lobe lung mass is intensely hypermetabolic and is
worrisome for primary lung neoplasm.  Correlation with tissue
sampling is suggested. No evidence for hypermetabolic metastases.

2.  Small right pleural effusion.
3.  Prominent coronary artery calcifications.
3.  Prior granulomatous disease
4.  Nonobstructing right renal calculus

## 2014-08-15 ENCOUNTER — Other Ambulatory Visit: Payer: Self-pay

## 2015-01-26 ENCOUNTER — Telehealth (HOSPITAL_COMMUNITY): Payer: Self-pay | Admitting: *Deleted

## 2015-01-26 NOTE — Telephone Encounter (Signed)
Called and spoke with dr. Rolla Plate regarding refill of Plavix '75mg'$  every day.  Per his request refill called in for Plavix '75mg'$  daily with 3 refills.

## 2015-06-06 DIAGNOSIS — Z85118 Personal history of other malignant neoplasm of bronchus and lung: Secondary | ICD-10-CM | POA: Diagnosis not present

## 2015-12-11 DIAGNOSIS — F17210 Nicotine dependence, cigarettes, uncomplicated: Secondary | ICD-10-CM | POA: Diagnosis not present

## 2015-12-11 DIAGNOSIS — Z85118 Personal history of other malignant neoplasm of bronchus and lung: Secondary | ICD-10-CM | POA: Diagnosis not present

## 2016-06-10 DIAGNOSIS — Z85118 Personal history of other malignant neoplasm of bronchus and lung: Secondary | ICD-10-CM | POA: Diagnosis not present

## 2016-07-29 DIAGNOSIS — L84 Corns and callosities: Secondary | ICD-10-CM | POA: Diagnosis not present

## 2016-07-29 DIAGNOSIS — Z1389 Encounter for screening for other disorder: Secondary | ICD-10-CM | POA: Diagnosis not present

## 2016-07-29 DIAGNOSIS — I10 Essential (primary) hypertension: Secondary | ICD-10-CM | POA: Diagnosis not present

## 2016-07-29 DIAGNOSIS — E785 Hyperlipidemia, unspecified: Secondary | ICD-10-CM | POA: Diagnosis not present

## 2016-07-29 DIAGNOSIS — J449 Chronic obstructive pulmonary disease, unspecified: Secondary | ICD-10-CM | POA: Diagnosis not present

## 2016-07-29 DIAGNOSIS — M1990 Unspecified osteoarthritis, unspecified site: Secondary | ICD-10-CM | POA: Diagnosis not present

## 2016-07-29 DIAGNOSIS — D539 Nutritional anemia, unspecified: Secondary | ICD-10-CM | POA: Diagnosis not present

## 2016-08-22 DIAGNOSIS — Z136 Encounter for screening for cardiovascular disorders: Secondary | ICD-10-CM | POA: Diagnosis not present

## 2016-08-22 DIAGNOSIS — Z1389 Encounter for screening for other disorder: Secondary | ICD-10-CM | POA: Diagnosis not present

## 2016-08-22 DIAGNOSIS — Z139 Encounter for screening, unspecified: Secondary | ICD-10-CM | POA: Diagnosis not present

## 2016-08-22 DIAGNOSIS — Z9181 History of falling: Secondary | ICD-10-CM | POA: Diagnosis not present

## 2016-08-22 DIAGNOSIS — E785 Hyperlipidemia, unspecified: Secondary | ICD-10-CM | POA: Diagnosis not present

## 2016-08-22 DIAGNOSIS — Z0000 Encounter for general adult medical examination without abnormal findings: Secondary | ICD-10-CM | POA: Diagnosis not present

## 2016-08-30 DIAGNOSIS — H5203 Hypermetropia, bilateral: Secondary | ICD-10-CM | POA: Diagnosis not present

## 2016-12-02 DIAGNOSIS — Z9181 History of falling: Secondary | ICD-10-CM | POA: Diagnosis not present

## 2016-12-02 DIAGNOSIS — M1990 Unspecified osteoarthritis, unspecified site: Secondary | ICD-10-CM | POA: Diagnosis not present

## 2016-12-02 DIAGNOSIS — J449 Chronic obstructive pulmonary disease, unspecified: Secondary | ICD-10-CM | POA: Diagnosis not present

## 2016-12-02 DIAGNOSIS — Z1389 Encounter for screening for other disorder: Secondary | ICD-10-CM | POA: Diagnosis not present

## 2016-12-02 DIAGNOSIS — E785 Hyperlipidemia, unspecified: Secondary | ICD-10-CM | POA: Diagnosis not present

## 2016-12-02 DIAGNOSIS — K219 Gastro-esophageal reflux disease without esophagitis: Secondary | ICD-10-CM | POA: Diagnosis not present

## 2016-12-02 DIAGNOSIS — D539 Nutritional anemia, unspecified: Secondary | ICD-10-CM | POA: Diagnosis not present

## 2016-12-02 DIAGNOSIS — K5909 Other constipation: Secondary | ICD-10-CM | POA: Diagnosis not present

## 2016-12-02 DIAGNOSIS — I10 Essential (primary) hypertension: Secondary | ICD-10-CM | POA: Diagnosis not present

## 2016-12-11 DIAGNOSIS — R918 Other nonspecific abnormal finding of lung field: Secondary | ICD-10-CM | POA: Diagnosis not present

## 2016-12-11 DIAGNOSIS — Z85118 Personal history of other malignant neoplasm of bronchus and lung: Secondary | ICD-10-CM | POA: Diagnosis not present

## 2016-12-11 DIAGNOSIS — F17210 Nicotine dependence, cigarettes, uncomplicated: Secondary | ICD-10-CM | POA: Diagnosis not present

## 2016-12-13 DIAGNOSIS — Z23 Encounter for immunization: Secondary | ICD-10-CM | POA: Diagnosis not present

## 2016-12-20 DIAGNOSIS — M25551 Pain in right hip: Secondary | ICD-10-CM | POA: Diagnosis not present

## 2016-12-20 DIAGNOSIS — K219 Gastro-esophageal reflux disease without esophagitis: Secondary | ICD-10-CM | POA: Diagnosis not present

## 2016-12-20 DIAGNOSIS — J309 Allergic rhinitis, unspecified: Secondary | ICD-10-CM | POA: Diagnosis not present

## 2016-12-20 DIAGNOSIS — R1031 Right lower quadrant pain: Secondary | ICD-10-CM | POA: Diagnosis not present

## 2017-01-03 DIAGNOSIS — M25551 Pain in right hip: Secondary | ICD-10-CM | POA: Diagnosis not present

## 2017-01-03 DIAGNOSIS — M5416 Radiculopathy, lumbar region: Secondary | ICD-10-CM | POA: Diagnosis not present

## 2017-01-03 DIAGNOSIS — K219 Gastro-esophageal reflux disease without esophagitis: Secondary | ICD-10-CM | POA: Diagnosis not present

## 2017-02-03 DIAGNOSIS — K219 Gastro-esophageal reflux disease without esophagitis: Secondary | ICD-10-CM | POA: Diagnosis not present

## 2017-02-03 DIAGNOSIS — Z139 Encounter for screening, unspecified: Secondary | ICD-10-CM | POA: Diagnosis not present

## 2017-02-14 DIAGNOSIS — M47817 Spondylosis without myelopathy or radiculopathy, lumbosacral region: Secondary | ICD-10-CM | POA: Diagnosis not present

## 2017-02-14 DIAGNOSIS — M5416 Radiculopathy, lumbar region: Secondary | ICD-10-CM | POA: Diagnosis not present

## 2017-04-08 DIAGNOSIS — E785 Hyperlipidemia, unspecified: Secondary | ICD-10-CM | POA: Diagnosis not present

## 2017-04-08 DIAGNOSIS — E039 Hypothyroidism, unspecified: Secondary | ICD-10-CM | POA: Diagnosis not present

## 2017-04-08 DIAGNOSIS — I10 Essential (primary) hypertension: Secondary | ICD-10-CM | POA: Diagnosis not present

## 2017-04-08 DIAGNOSIS — Z6823 Body mass index (BMI) 23.0-23.9, adult: Secondary | ICD-10-CM | POA: Diagnosis not present

## 2017-04-08 DIAGNOSIS — J449 Chronic obstructive pulmonary disease, unspecified: Secondary | ICD-10-CM | POA: Diagnosis not present

## 2017-04-08 DIAGNOSIS — M1990 Unspecified osteoarthritis, unspecified site: Secondary | ICD-10-CM | POA: Diagnosis not present

## 2017-04-08 DIAGNOSIS — D539 Nutritional anemia, unspecified: Secondary | ICD-10-CM | POA: Diagnosis not present

## 2017-04-15 DIAGNOSIS — K559 Vascular disorder of intestine, unspecified: Secondary | ICD-10-CM | POA: Diagnosis not present

## 2017-04-15 DIAGNOSIS — Z85118 Personal history of other malignant neoplasm of bronchus and lung: Secondary | ICD-10-CM | POA: Diagnosis not present

## 2017-04-15 DIAGNOSIS — F17210 Nicotine dependence, cigarettes, uncomplicated: Secondary | ICD-10-CM | POA: Diagnosis not present

## 2017-04-15 DIAGNOSIS — F0390 Unspecified dementia without behavioral disturbance: Secondary | ICD-10-CM | POA: Diagnosis not present

## 2017-04-15 DIAGNOSIS — R531 Weakness: Secondary | ICD-10-CM | POA: Diagnosis not present

## 2017-04-15 DIAGNOSIS — Z853 Personal history of malignant neoplasm of breast: Secondary | ICD-10-CM | POA: Diagnosis not present

## 2017-04-15 DIAGNOSIS — I70209 Unspecified atherosclerosis of native arteries of extremities, unspecified extremity: Secondary | ICD-10-CM | POA: Diagnosis not present

## 2017-04-15 DIAGNOSIS — J449 Chronic obstructive pulmonary disease, unspecified: Secondary | ICD-10-CM | POA: Diagnosis not present

## 2017-04-15 DIAGNOSIS — E876 Hypokalemia: Secondary | ICD-10-CM | POA: Diagnosis not present

## 2017-04-15 DIAGNOSIS — I739 Peripheral vascular disease, unspecified: Secondary | ICD-10-CM | POA: Diagnosis not present

## 2017-04-15 DIAGNOSIS — I7090 Unspecified atherosclerosis: Secondary | ICD-10-CM | POA: Diagnosis not present

## 2017-08-07 DIAGNOSIS — K219 Gastro-esophageal reflux disease without esophagitis: Secondary | ICD-10-CM | POA: Diagnosis not present

## 2017-08-07 DIAGNOSIS — D539 Nutritional anemia, unspecified: Secondary | ICD-10-CM | POA: Diagnosis not present

## 2017-08-07 DIAGNOSIS — E785 Hyperlipidemia, unspecified: Secondary | ICD-10-CM | POA: Diagnosis not present

## 2017-08-07 DIAGNOSIS — I10 Essential (primary) hypertension: Secondary | ICD-10-CM | POA: Diagnosis not present

## 2017-08-07 DIAGNOSIS — J449 Chronic obstructive pulmonary disease, unspecified: Secondary | ICD-10-CM | POA: Diagnosis not present

## 2017-08-07 DIAGNOSIS — M1990 Unspecified osteoarthritis, unspecified site: Secondary | ICD-10-CM | POA: Diagnosis not present

## 2017-08-07 DIAGNOSIS — K551 Chronic vascular disorders of intestine: Secondary | ICD-10-CM | POA: Diagnosis not present

## 2017-08-07 DIAGNOSIS — I739 Peripheral vascular disease, unspecified: Secondary | ICD-10-CM | POA: Diagnosis not present

## 2017-08-25 DIAGNOSIS — N959 Unspecified menopausal and perimenopausal disorder: Secondary | ICD-10-CM | POA: Diagnosis not present

## 2017-08-25 DIAGNOSIS — Z9181 History of falling: Secondary | ICD-10-CM | POA: Diagnosis not present

## 2017-08-25 DIAGNOSIS — Z136 Encounter for screening for cardiovascular disorders: Secondary | ICD-10-CM | POA: Diagnosis not present

## 2017-08-25 DIAGNOSIS — Z1331 Encounter for screening for depression: Secondary | ICD-10-CM | POA: Diagnosis not present

## 2017-08-25 DIAGNOSIS — Z1231 Encounter for screening mammogram for malignant neoplasm of breast: Secondary | ICD-10-CM | POA: Diagnosis not present

## 2017-08-25 DIAGNOSIS — Z0000 Encounter for general adult medical examination without abnormal findings: Secondary | ICD-10-CM | POA: Diagnosis not present

## 2017-08-25 DIAGNOSIS — E785 Hyperlipidemia, unspecified: Secondary | ICD-10-CM | POA: Diagnosis not present

## 2017-09-09 DIAGNOSIS — M85851 Other specified disorders of bone density and structure, right thigh: Secondary | ICD-10-CM | POA: Diagnosis not present

## 2017-09-09 DIAGNOSIS — Z1231 Encounter for screening mammogram for malignant neoplasm of breast: Secondary | ICD-10-CM | POA: Diagnosis not present

## 2017-09-09 DIAGNOSIS — M8589 Other specified disorders of bone density and structure, multiple sites: Secondary | ICD-10-CM | POA: Diagnosis not present

## 2017-09-09 DIAGNOSIS — M85852 Other specified disorders of bone density and structure, left thigh: Secondary | ICD-10-CM | POA: Diagnosis not present

## 2017-09-09 DIAGNOSIS — N959 Unspecified menopausal and perimenopausal disorder: Secondary | ICD-10-CM | POA: Diagnosis not present

## 2017-09-09 DIAGNOSIS — F17200 Nicotine dependence, unspecified, uncomplicated: Secondary | ICD-10-CM | POA: Diagnosis not present

## 2017-10-06 DIAGNOSIS — J449 Chronic obstructive pulmonary disease, unspecified: Secondary | ICD-10-CM | POA: Diagnosis not present

## 2017-10-06 DIAGNOSIS — M1990 Unspecified osteoarthritis, unspecified site: Secondary | ICD-10-CM | POA: Diagnosis not present

## 2017-11-03 DIAGNOSIS — J441 Chronic obstructive pulmonary disease with (acute) exacerbation: Secondary | ICD-10-CM | POA: Diagnosis not present

## 2017-12-09 DIAGNOSIS — M1990 Unspecified osteoarthritis, unspecified site: Secondary | ICD-10-CM | POA: Diagnosis not present

## 2017-12-09 DIAGNOSIS — I10 Essential (primary) hypertension: Secondary | ICD-10-CM | POA: Diagnosis not present

## 2017-12-09 DIAGNOSIS — D539 Nutritional anemia, unspecified: Secondary | ICD-10-CM | POA: Diagnosis not present

## 2017-12-09 DIAGNOSIS — Z1339 Encounter for screening examination for other mental health and behavioral disorders: Secondary | ICD-10-CM | POA: Diagnosis not present

## 2017-12-09 DIAGNOSIS — K219 Gastro-esophageal reflux disease without esophagitis: Secondary | ICD-10-CM | POA: Diagnosis not present

## 2017-12-09 DIAGNOSIS — J449 Chronic obstructive pulmonary disease, unspecified: Secondary | ICD-10-CM | POA: Diagnosis not present

## 2017-12-09 DIAGNOSIS — M810 Age-related osteoporosis without current pathological fracture: Secondary | ICD-10-CM | POA: Diagnosis not present

## 2017-12-09 DIAGNOSIS — E785 Hyperlipidemia, unspecified: Secondary | ICD-10-CM | POA: Diagnosis not present

## 2017-12-09 DIAGNOSIS — I739 Peripheral vascular disease, unspecified: Secondary | ICD-10-CM | POA: Diagnosis not present

## 2018-02-19 DIAGNOSIS — B079 Viral wart, unspecified: Secondary | ICD-10-CM | POA: Diagnosis not present

## 2018-02-26 DIAGNOSIS — Z6822 Body mass index (BMI) 22.0-22.9, adult: Secondary | ICD-10-CM | POA: Diagnosis not present

## 2018-02-26 DIAGNOSIS — J441 Chronic obstructive pulmonary disease with (acute) exacerbation: Secondary | ICD-10-CM | POA: Diagnosis not present

## 2018-03-04 DIAGNOSIS — B079 Viral wart, unspecified: Secondary | ICD-10-CM | POA: Diagnosis not present

## 2018-04-10 DIAGNOSIS — K219 Gastro-esophageal reflux disease without esophagitis: Secondary | ICD-10-CM | POA: Diagnosis not present

## 2018-04-10 DIAGNOSIS — M1990 Unspecified osteoarthritis, unspecified site: Secondary | ICD-10-CM | POA: Diagnosis not present

## 2018-04-10 DIAGNOSIS — E785 Hyperlipidemia, unspecified: Secondary | ICD-10-CM | POA: Diagnosis not present

## 2018-04-10 DIAGNOSIS — J449 Chronic obstructive pulmonary disease, unspecified: Secondary | ICD-10-CM | POA: Diagnosis not present

## 2018-04-10 DIAGNOSIS — I10 Essential (primary) hypertension: Secondary | ICD-10-CM | POA: Diagnosis not present

## 2018-04-10 DIAGNOSIS — D539 Nutritional anemia, unspecified: Secondary | ICD-10-CM | POA: Diagnosis not present

## 2018-04-10 DIAGNOSIS — E039 Hypothyroidism, unspecified: Secondary | ICD-10-CM | POA: Diagnosis not present

## 2018-04-10 DIAGNOSIS — I739 Peripheral vascular disease, unspecified: Secondary | ICD-10-CM | POA: Diagnosis not present

## 2018-04-10 DIAGNOSIS — Z139 Encounter for screening, unspecified: Secondary | ICD-10-CM | POA: Diagnosis not present

## 2018-08-13 DIAGNOSIS — E039 Hypothyroidism, unspecified: Secondary | ICD-10-CM | POA: Diagnosis not present

## 2018-08-13 DIAGNOSIS — E785 Hyperlipidemia, unspecified: Secondary | ICD-10-CM | POA: Diagnosis not present

## 2018-08-13 DIAGNOSIS — J449 Chronic obstructive pulmonary disease, unspecified: Secondary | ICD-10-CM | POA: Diagnosis not present

## 2018-08-13 DIAGNOSIS — M810 Age-related osteoporosis without current pathological fracture: Secondary | ICD-10-CM | POA: Diagnosis not present

## 2018-08-13 DIAGNOSIS — I10 Essential (primary) hypertension: Secondary | ICD-10-CM | POA: Diagnosis not present

## 2018-08-13 DIAGNOSIS — K219 Gastro-esophageal reflux disease without esophagitis: Secondary | ICD-10-CM | POA: Diagnosis not present

## 2018-08-13 DIAGNOSIS — F17200 Nicotine dependence, unspecified, uncomplicated: Secondary | ICD-10-CM | POA: Diagnosis not present

## 2018-08-13 DIAGNOSIS — I739 Peripheral vascular disease, unspecified: Secondary | ICD-10-CM | POA: Diagnosis not present

## 2018-08-13 DIAGNOSIS — M1990 Unspecified osteoarthritis, unspecified site: Secondary | ICD-10-CM | POA: Diagnosis not present

## 2018-08-13 DIAGNOSIS — D692 Other nonthrombocytopenic purpura: Secondary | ICD-10-CM | POA: Diagnosis not present

## 2018-08-27 DIAGNOSIS — Z1331 Encounter for screening for depression: Secondary | ICD-10-CM | POA: Diagnosis not present

## 2018-08-27 DIAGNOSIS — E785 Hyperlipidemia, unspecified: Secondary | ICD-10-CM | POA: Diagnosis not present

## 2018-08-27 DIAGNOSIS — Z0000 Encounter for general adult medical examination without abnormal findings: Secondary | ICD-10-CM | POA: Diagnosis not present

## 2018-08-27 DIAGNOSIS — Z9181 History of falling: Secondary | ICD-10-CM | POA: Diagnosis not present

## 2018-08-27 DIAGNOSIS — Z1211 Encounter for screening for malignant neoplasm of colon: Secondary | ICD-10-CM | POA: Diagnosis not present

## 2018-08-27 DIAGNOSIS — Z1231 Encounter for screening mammogram for malignant neoplasm of breast: Secondary | ICD-10-CM | POA: Diagnosis not present

## 2018-09-16 DIAGNOSIS — K5900 Constipation, unspecified: Secondary | ICD-10-CM | POA: Diagnosis not present

## 2018-10-12 DIAGNOSIS — Z1231 Encounter for screening mammogram for malignant neoplasm of breast: Secondary | ICD-10-CM | POA: Diagnosis not present

## 2018-10-29 DIAGNOSIS — K5730 Diverticulosis of large intestine without perforation or abscess without bleeding: Secondary | ICD-10-CM | POA: Diagnosis not present

## 2018-10-29 DIAGNOSIS — Z09 Encounter for follow-up examination after completed treatment for conditions other than malignant neoplasm: Secondary | ICD-10-CM | POA: Diagnosis not present

## 2018-10-29 DIAGNOSIS — Z86010 Personal history of colonic polyps: Secondary | ICD-10-CM | POA: Diagnosis not present

## 2018-10-29 DIAGNOSIS — Z955 Presence of coronary angioplasty implant and graft: Secondary | ICD-10-CM | POA: Diagnosis not present

## 2018-10-29 DIAGNOSIS — I10 Essential (primary) hypertension: Secondary | ICD-10-CM | POA: Diagnosis not present

## 2018-10-29 DIAGNOSIS — Z85118 Personal history of other malignant neoplasm of bronchus and lung: Secondary | ICD-10-CM | POA: Diagnosis not present

## 2018-10-29 DIAGNOSIS — F17210 Nicotine dependence, cigarettes, uncomplicated: Secondary | ICD-10-CM | POA: Diagnosis not present

## 2018-10-29 DIAGNOSIS — I739 Peripheral vascular disease, unspecified: Secondary | ICD-10-CM | POA: Diagnosis not present

## 2018-10-29 DIAGNOSIS — J439 Emphysema, unspecified: Secondary | ICD-10-CM | POA: Diagnosis not present

## 2018-10-29 DIAGNOSIS — Z853 Personal history of malignant neoplasm of breast: Secondary | ICD-10-CM | POA: Diagnosis not present

## 2018-11-05 DIAGNOSIS — H2513 Age-related nuclear cataract, bilateral: Secondary | ICD-10-CM | POA: Diagnosis not present

## 2018-11-12 DIAGNOSIS — K625 Hemorrhage of anus and rectum: Secondary | ICD-10-CM | POA: Diagnosis not present

## 2018-11-12 DIAGNOSIS — Z6821 Body mass index (BMI) 21.0-21.9, adult: Secondary | ICD-10-CM | POA: Diagnosis not present

## 2018-11-12 DIAGNOSIS — R109 Unspecified abdominal pain: Secondary | ICD-10-CM | POA: Diagnosis not present

## 2018-11-13 DIAGNOSIS — K625 Hemorrhage of anus and rectum: Secondary | ICD-10-CM | POA: Diagnosis not present

## 2018-11-16 DIAGNOSIS — K921 Melena: Secondary | ICD-10-CM | POA: Diagnosis not present

## 2018-11-16 DIAGNOSIS — K625 Hemorrhage of anus and rectum: Secondary | ICD-10-CM | POA: Diagnosis not present

## 2018-12-14 DIAGNOSIS — I739 Peripheral vascular disease, unspecified: Secondary | ICD-10-CM | POA: Diagnosis not present

## 2018-12-14 DIAGNOSIS — E785 Hyperlipidemia, unspecified: Secondary | ICD-10-CM | POA: Diagnosis not present

## 2018-12-14 DIAGNOSIS — J449 Chronic obstructive pulmonary disease, unspecified: Secondary | ICD-10-CM | POA: Diagnosis not present

## 2018-12-14 DIAGNOSIS — K219 Gastro-esophageal reflux disease without esophagitis: Secondary | ICD-10-CM | POA: Diagnosis not present

## 2018-12-14 DIAGNOSIS — K625 Hemorrhage of anus and rectum: Secondary | ICD-10-CM | POA: Diagnosis not present

## 2018-12-14 DIAGNOSIS — E039 Hypothyroidism, unspecified: Secondary | ICD-10-CM | POA: Diagnosis not present

## 2018-12-14 DIAGNOSIS — M1990 Unspecified osteoarthritis, unspecified site: Secondary | ICD-10-CM | POA: Diagnosis not present

## 2018-12-14 DIAGNOSIS — I10 Essential (primary) hypertension: Secondary | ICD-10-CM | POA: Diagnosis not present

## 2018-12-14 DIAGNOSIS — M810 Age-related osteoporosis without current pathological fracture: Secondary | ICD-10-CM | POA: Diagnosis not present

## 2019-02-23 DIAGNOSIS — S46819A Strain of other muscles, fascia and tendons at shoulder and upper arm level, unspecified arm, initial encounter: Secondary | ICD-10-CM | POA: Diagnosis not present

## 2019-04-19 DIAGNOSIS — M810 Age-related osteoporosis without current pathological fracture: Secondary | ICD-10-CM | POA: Diagnosis not present

## 2019-04-19 DIAGNOSIS — I739 Peripheral vascular disease, unspecified: Secondary | ICD-10-CM | POA: Diagnosis not present

## 2019-04-19 DIAGNOSIS — D539 Nutritional anemia, unspecified: Secondary | ICD-10-CM | POA: Diagnosis not present

## 2019-04-19 DIAGNOSIS — E039 Hypothyroidism, unspecified: Secondary | ICD-10-CM | POA: Diagnosis not present

## 2019-04-19 DIAGNOSIS — M1990 Unspecified osteoarthritis, unspecified site: Secondary | ICD-10-CM | POA: Diagnosis not present

## 2019-04-19 DIAGNOSIS — K219 Gastro-esophageal reflux disease without esophagitis: Secondary | ICD-10-CM | POA: Diagnosis not present

## 2019-04-19 DIAGNOSIS — E785 Hyperlipidemia, unspecified: Secondary | ICD-10-CM | POA: Diagnosis not present

## 2019-04-19 DIAGNOSIS — I10 Essential (primary) hypertension: Secondary | ICD-10-CM | POA: Diagnosis not present

## 2019-04-19 DIAGNOSIS — J449 Chronic obstructive pulmonary disease, unspecified: Secondary | ICD-10-CM | POA: Diagnosis not present

## 2019-08-17 DIAGNOSIS — R413 Other amnesia: Secondary | ICD-10-CM | POA: Diagnosis not present

## 2019-08-17 DIAGNOSIS — D539 Nutritional anemia, unspecified: Secondary | ICD-10-CM | POA: Diagnosis not present

## 2019-08-17 DIAGNOSIS — I739 Peripheral vascular disease, unspecified: Secondary | ICD-10-CM | POA: Diagnosis not present

## 2019-08-17 DIAGNOSIS — J449 Chronic obstructive pulmonary disease, unspecified: Secondary | ICD-10-CM | POA: Diagnosis not present

## 2019-08-17 DIAGNOSIS — I10 Essential (primary) hypertension: Secondary | ICD-10-CM | POA: Diagnosis not present

## 2019-08-17 DIAGNOSIS — K219 Gastro-esophageal reflux disease without esophagitis: Secondary | ICD-10-CM | POA: Diagnosis not present

## 2019-08-17 DIAGNOSIS — M1990 Unspecified osteoarthritis, unspecified site: Secondary | ICD-10-CM | POA: Diagnosis not present

## 2019-08-17 DIAGNOSIS — E785 Hyperlipidemia, unspecified: Secondary | ICD-10-CM | POA: Diagnosis not present

## 2019-08-17 DIAGNOSIS — E039 Hypothyroidism, unspecified: Secondary | ICD-10-CM | POA: Diagnosis not present

## 2019-08-25 DIAGNOSIS — R413 Other amnesia: Secondary | ICD-10-CM | POA: Diagnosis not present

## 2019-08-31 DIAGNOSIS — Z0000 Encounter for general adult medical examination without abnormal findings: Secondary | ICD-10-CM | POA: Diagnosis not present

## 2019-08-31 DIAGNOSIS — E785 Hyperlipidemia, unspecified: Secondary | ICD-10-CM | POA: Diagnosis not present

## 2019-08-31 DIAGNOSIS — Z1331 Encounter for screening for depression: Secondary | ICD-10-CM | POA: Diagnosis not present

## 2019-08-31 DIAGNOSIS — Z9181 History of falling: Secondary | ICD-10-CM | POA: Diagnosis not present

## 2019-08-31 DIAGNOSIS — Z139 Encounter for screening, unspecified: Secondary | ICD-10-CM | POA: Diagnosis not present

## 2019-09-08 DIAGNOSIS — D485 Neoplasm of uncertain behavior of skin: Secondary | ICD-10-CM | POA: Diagnosis not present

## 2019-09-08 DIAGNOSIS — L821 Other seborrheic keratosis: Secondary | ICD-10-CM | POA: Diagnosis not present

## 2019-09-16 DIAGNOSIS — R413 Other amnesia: Secondary | ICD-10-CM | POA: Diagnosis not present

## 2019-09-16 DIAGNOSIS — F09 Unspecified mental disorder due to known physiological condition: Secondary | ICD-10-CM | POA: Diagnosis not present

## 2019-09-16 DIAGNOSIS — Z6824 Body mass index (BMI) 24.0-24.9, adult: Secondary | ICD-10-CM | POA: Diagnosis not present

## 2019-10-12 DIAGNOSIS — Z6824 Body mass index (BMI) 24.0-24.9, adult: Secondary | ICD-10-CM | POA: Diagnosis not present

## 2019-10-12 DIAGNOSIS — N390 Urinary tract infection, site not specified: Secondary | ICD-10-CM | POA: Diagnosis not present

## 2019-10-12 DIAGNOSIS — R300 Dysuria: Secondary | ICD-10-CM | POA: Diagnosis not present

## 2019-11-15 DIAGNOSIS — Z6824 Body mass index (BMI) 24.0-24.9, adult: Secondary | ICD-10-CM | POA: Diagnosis not present

## 2019-11-15 DIAGNOSIS — R413 Other amnesia: Secondary | ICD-10-CM | POA: Diagnosis not present

## 2019-11-15 DIAGNOSIS — F09 Unspecified mental disorder due to known physiological condition: Secondary | ICD-10-CM | POA: Diagnosis not present

## 2019-12-21 DIAGNOSIS — M47816 Spondylosis without myelopathy or radiculopathy, lumbar region: Secondary | ICD-10-CM | POA: Diagnosis not present

## 2019-12-21 DIAGNOSIS — T192XXA Foreign body in vulva and vagina, initial encounter: Secondary | ICD-10-CM | POA: Diagnosis not present

## 2019-12-21 DIAGNOSIS — T189XXA Foreign body of alimentary tract, part unspecified, initial encounter: Secondary | ICD-10-CM | POA: Diagnosis not present

## 2020-01-03 DIAGNOSIS — L820 Inflamed seborrheic keratosis: Secondary | ICD-10-CM | POA: Diagnosis not present

## 2020-02-03 DIAGNOSIS — R519 Headache, unspecified: Secondary | ICD-10-CM | POA: Diagnosis not present

## 2020-02-03 DIAGNOSIS — R413 Other amnesia: Secondary | ICD-10-CM | POA: Diagnosis not present

## 2020-02-03 DIAGNOSIS — I10 Essential (primary) hypertension: Secondary | ICD-10-CM | POA: Diagnosis not present

## 2020-02-03 DIAGNOSIS — Z6824 Body mass index (BMI) 24.0-24.9, adult: Secondary | ICD-10-CM | POA: Diagnosis not present

## 2020-02-03 DIAGNOSIS — Z85118 Personal history of other malignant neoplasm of bronchus and lung: Secondary | ICD-10-CM | POA: Diagnosis not present

## 2020-02-03 DIAGNOSIS — I679 Cerebrovascular disease, unspecified: Secondary | ICD-10-CM | POA: Diagnosis not present

## 2020-02-18 DIAGNOSIS — R413 Other amnesia: Secondary | ICD-10-CM | POA: Diagnosis not present

## 2020-03-13 DIAGNOSIS — R413 Other amnesia: Secondary | ICD-10-CM | POA: Diagnosis not present

## 2020-03-13 DIAGNOSIS — I6381 Other cerebral infarction due to occlusion or stenosis of small artery: Secondary | ICD-10-CM | POA: Diagnosis not present

## 2020-03-13 DIAGNOSIS — R519 Headache, unspecified: Secondary | ICD-10-CM | POA: Diagnosis not present

## 2020-03-24 DIAGNOSIS — J449 Chronic obstructive pulmonary disease, unspecified: Secondary | ICD-10-CM | POA: Diagnosis not present

## 2020-03-24 DIAGNOSIS — R413 Other amnesia: Secondary | ICD-10-CM | POA: Diagnosis not present

## 2020-03-24 DIAGNOSIS — Z6824 Body mass index (BMI) 24.0-24.9, adult: Secondary | ICD-10-CM | POA: Diagnosis not present

## 2020-03-24 DIAGNOSIS — Z79899 Other long term (current) drug therapy: Secondary | ICD-10-CM | POA: Diagnosis not present

## 2020-03-24 DIAGNOSIS — R519 Headache, unspecified: Secondary | ICD-10-CM | POA: Diagnosis not present

## 2020-03-24 DIAGNOSIS — E538 Deficiency of other specified B group vitamins: Secondary | ICD-10-CM | POA: Diagnosis not present

## 2020-03-24 DIAGNOSIS — E039 Hypothyroidism, unspecified: Secondary | ICD-10-CM | POA: Diagnosis not present

## 2020-03-31 DIAGNOSIS — E538 Deficiency of other specified B group vitamins: Secondary | ICD-10-CM | POA: Diagnosis not present

## 2020-04-07 DIAGNOSIS — E538 Deficiency of other specified B group vitamins: Secondary | ICD-10-CM | POA: Diagnosis not present

## 2020-04-13 DIAGNOSIS — R519 Headache, unspecified: Secondary | ICD-10-CM | POA: Diagnosis not present

## 2020-04-13 DIAGNOSIS — E538 Deficiency of other specified B group vitamins: Secondary | ICD-10-CM | POA: Diagnosis not present

## 2020-04-13 DIAGNOSIS — E039 Hypothyroidism, unspecified: Secondary | ICD-10-CM | POA: Diagnosis not present

## 2020-05-15 DIAGNOSIS — R519 Headache, unspecified: Secondary | ICD-10-CM | POA: Diagnosis not present

## 2020-05-15 DIAGNOSIS — E538 Deficiency of other specified B group vitamins: Secondary | ICD-10-CM | POA: Diagnosis not present

## 2020-05-15 DIAGNOSIS — E039 Hypothyroidism, unspecified: Secondary | ICD-10-CM | POA: Diagnosis not present

## 2020-05-19 DIAGNOSIS — R300 Dysuria: Secondary | ICD-10-CM | POA: Diagnosis not present

## 2020-05-19 DIAGNOSIS — N390 Urinary tract infection, site not specified: Secondary | ICD-10-CM | POA: Diagnosis not present

## 2020-06-16 DIAGNOSIS — R413 Other amnesia: Secondary | ICD-10-CM | POA: Diagnosis not present

## 2020-06-16 DIAGNOSIS — Z8673 Personal history of transient ischemic attack (TIA), and cerebral infarction without residual deficits: Secondary | ICD-10-CM | POA: Diagnosis not present

## 2020-06-19 DIAGNOSIS — R519 Headache, unspecified: Secondary | ICD-10-CM | POA: Diagnosis not present

## 2020-06-19 DIAGNOSIS — E785 Hyperlipidemia, unspecified: Secondary | ICD-10-CM | POA: Diagnosis not present

## 2020-06-19 DIAGNOSIS — I739 Peripheral vascular disease, unspecified: Secondary | ICD-10-CM | POA: Diagnosis not present

## 2020-06-19 DIAGNOSIS — E039 Hypothyroidism, unspecified: Secondary | ICD-10-CM | POA: Diagnosis not present

## 2020-06-19 DIAGNOSIS — E538 Deficiency of other specified B group vitamins: Secondary | ICD-10-CM | POA: Diagnosis not present

## 2020-06-19 DIAGNOSIS — Z6823 Body mass index (BMI) 23.0-23.9, adult: Secondary | ICD-10-CM | POA: Diagnosis not present

## 2020-06-19 DIAGNOSIS — F09 Unspecified mental disorder due to known physiological condition: Secondary | ICD-10-CM | POA: Diagnosis not present

## 2020-06-19 DIAGNOSIS — I10 Essential (primary) hypertension: Secondary | ICD-10-CM | POA: Diagnosis not present

## 2020-06-19 DIAGNOSIS — J449 Chronic obstructive pulmonary disease, unspecified: Secondary | ICD-10-CM | POA: Diagnosis not present

## 2020-07-20 DIAGNOSIS — F09 Unspecified mental disorder due to known physiological condition: Secondary | ICD-10-CM | POA: Diagnosis not present

## 2020-07-20 DIAGNOSIS — E538 Deficiency of other specified B group vitamins: Secondary | ICD-10-CM | POA: Diagnosis not present

## 2020-08-04 DIAGNOSIS — I10 Essential (primary) hypertension: Secondary | ICD-10-CM | POA: Diagnosis not present

## 2020-08-04 DIAGNOSIS — R519 Headache, unspecified: Secondary | ICD-10-CM | POA: Diagnosis not present

## 2020-08-21 DIAGNOSIS — E538 Deficiency of other specified B group vitamins: Secondary | ICD-10-CM | POA: Diagnosis not present

## 2020-08-31 DIAGNOSIS — E785 Hyperlipidemia, unspecified: Secondary | ICD-10-CM | POA: Diagnosis not present

## 2020-08-31 DIAGNOSIS — Z1331 Encounter for screening for depression: Secondary | ICD-10-CM | POA: Diagnosis not present

## 2020-08-31 DIAGNOSIS — Z9181 History of falling: Secondary | ICD-10-CM | POA: Diagnosis not present

## 2020-08-31 DIAGNOSIS — Z0000 Encounter for general adult medical examination without abnormal findings: Secondary | ICD-10-CM | POA: Diagnosis not present

## 2020-09-04 DIAGNOSIS — H2703 Aphakia, bilateral: Secondary | ICD-10-CM | POA: Diagnosis not present

## 2020-09-29 DIAGNOSIS — N959 Unspecified menopausal and perimenopausal disorder: Secondary | ICD-10-CM | POA: Diagnosis not present

## 2020-09-29 DIAGNOSIS — M810 Age-related osteoporosis without current pathological fracture: Secondary | ICD-10-CM | POA: Diagnosis not present

## 2020-09-29 DIAGNOSIS — Z1231 Encounter for screening mammogram for malignant neoplasm of breast: Secondary | ICD-10-CM | POA: Diagnosis not present

## 2020-11-03 DIAGNOSIS — J449 Chronic obstructive pulmonary disease, unspecified: Secondary | ICD-10-CM | POA: Diagnosis not present

## 2020-11-03 DIAGNOSIS — E538 Deficiency of other specified B group vitamins: Secondary | ICD-10-CM | POA: Diagnosis not present

## 2020-11-03 DIAGNOSIS — I10 Essential (primary) hypertension: Secondary | ICD-10-CM | POA: Diagnosis not present

## 2020-11-03 DIAGNOSIS — E039 Hypothyroidism, unspecified: Secondary | ICD-10-CM | POA: Diagnosis not present

## 2020-11-03 DIAGNOSIS — R519 Headache, unspecified: Secondary | ICD-10-CM | POA: Diagnosis not present

## 2020-11-03 DIAGNOSIS — Z6822 Body mass index (BMI) 22.0-22.9, adult: Secondary | ICD-10-CM | POA: Diagnosis not present

## 2020-11-03 DIAGNOSIS — F09 Unspecified mental disorder due to known physiological condition: Secondary | ICD-10-CM | POA: Diagnosis not present

## 2020-11-03 DIAGNOSIS — E785 Hyperlipidemia, unspecified: Secondary | ICD-10-CM | POA: Diagnosis not present

## 2020-11-03 DIAGNOSIS — I739 Peripheral vascular disease, unspecified: Secondary | ICD-10-CM | POA: Diagnosis not present

## 2020-12-01 DIAGNOSIS — R519 Headache, unspecified: Secondary | ICD-10-CM | POA: Diagnosis not present

## 2020-12-01 DIAGNOSIS — I10 Essential (primary) hypertension: Secondary | ICD-10-CM | POA: Diagnosis not present

## 2020-12-05 DIAGNOSIS — E538 Deficiency of other specified B group vitamins: Secondary | ICD-10-CM | POA: Diagnosis not present

## 2020-12-05 DIAGNOSIS — R519 Headache, unspecified: Secondary | ICD-10-CM | POA: Diagnosis not present

## 2021-01-08 DIAGNOSIS — E538 Deficiency of other specified B group vitamins: Secondary | ICD-10-CM | POA: Diagnosis not present

## 2021-01-08 DIAGNOSIS — R519 Headache, unspecified: Secondary | ICD-10-CM | POA: Diagnosis not present

## 2021-01-08 DIAGNOSIS — Z139 Encounter for screening, unspecified: Secondary | ICD-10-CM | POA: Diagnosis not present

## 2021-02-02 DIAGNOSIS — I739 Peripheral vascular disease, unspecified: Secondary | ICD-10-CM | POA: Diagnosis not present

## 2021-02-02 DIAGNOSIS — M810 Age-related osteoporosis without current pathological fracture: Secondary | ICD-10-CM | POA: Diagnosis not present

## 2021-02-02 DIAGNOSIS — E785 Hyperlipidemia, unspecified: Secondary | ICD-10-CM | POA: Diagnosis not present

## 2021-02-02 DIAGNOSIS — D692 Other nonthrombocytopenic purpura: Secondary | ICD-10-CM | POA: Diagnosis not present

## 2021-02-02 DIAGNOSIS — Z6821 Body mass index (BMI) 21.0-21.9, adult: Secondary | ICD-10-CM | POA: Diagnosis not present

## 2021-02-02 DIAGNOSIS — F09 Unspecified mental disorder due to known physiological condition: Secondary | ICD-10-CM | POA: Diagnosis not present

## 2021-02-02 DIAGNOSIS — R519 Headache, unspecified: Secondary | ICD-10-CM | POA: Diagnosis not present

## 2021-02-02 DIAGNOSIS — J449 Chronic obstructive pulmonary disease, unspecified: Secondary | ICD-10-CM | POA: Diagnosis not present

## 2021-02-02 DIAGNOSIS — E039 Hypothyroidism, unspecified: Secondary | ICD-10-CM | POA: Diagnosis not present

## 2021-02-02 DIAGNOSIS — I10 Essential (primary) hypertension: Secondary | ICD-10-CM | POA: Diagnosis not present

## 2021-02-02 DIAGNOSIS — E538 Deficiency of other specified B group vitamins: Secondary | ICD-10-CM | POA: Diagnosis not present

## 2021-02-06 DIAGNOSIS — E538 Deficiency of other specified B group vitamins: Secondary | ICD-10-CM | POA: Diagnosis not present

## 2021-02-06 DIAGNOSIS — Z8673 Personal history of transient ischemic attack (TIA), and cerebral infarction without residual deficits: Secondary | ICD-10-CM | POA: Diagnosis not present

## 2021-02-06 DIAGNOSIS — R413 Other amnesia: Secondary | ICD-10-CM | POA: Diagnosis not present

## 2021-02-06 DIAGNOSIS — R519 Headache, unspecified: Secondary | ICD-10-CM | POA: Diagnosis not present

## 2021-05-11 DIAGNOSIS — E785 Hyperlipidemia, unspecified: Secondary | ICD-10-CM | POA: Diagnosis not present

## 2021-05-11 DIAGNOSIS — M810 Age-related osteoporosis without current pathological fracture: Secondary | ICD-10-CM | POA: Diagnosis not present

## 2021-05-11 DIAGNOSIS — F09 Unspecified mental disorder due to known physiological condition: Secondary | ICD-10-CM | POA: Diagnosis not present

## 2021-05-11 DIAGNOSIS — D692 Other nonthrombocytopenic purpura: Secondary | ICD-10-CM | POA: Diagnosis not present

## 2021-05-11 DIAGNOSIS — I10 Essential (primary) hypertension: Secondary | ICD-10-CM | POA: Diagnosis not present

## 2021-05-11 DIAGNOSIS — I739 Peripheral vascular disease, unspecified: Secondary | ICD-10-CM | POA: Diagnosis not present

## 2021-05-11 DIAGNOSIS — E538 Deficiency of other specified B group vitamins: Secondary | ICD-10-CM | POA: Diagnosis not present

## 2021-05-11 DIAGNOSIS — I4891 Unspecified atrial fibrillation: Secondary | ICD-10-CM | POA: Diagnosis not present

## 2021-05-11 DIAGNOSIS — E039 Hypothyroidism, unspecified: Secondary | ICD-10-CM | POA: Diagnosis not present

## 2021-05-11 DIAGNOSIS — J449 Chronic obstructive pulmonary disease, unspecified: Secondary | ICD-10-CM | POA: Diagnosis not present

## 2021-05-17 ENCOUNTER — Encounter: Payer: Self-pay | Admitting: Cardiology

## 2021-05-17 ENCOUNTER — Ambulatory Visit: Payer: Medicare HMO | Admitting: Cardiology

## 2021-05-17 ENCOUNTER — Other Ambulatory Visit: Payer: Self-pay

## 2021-05-17 VITALS — BP 120/100 | HR 69 | Ht 64.0 in | Wt 121.0 lb

## 2021-05-17 DIAGNOSIS — C3490 Malignant neoplasm of unspecified part of unspecified bronchus or lung: Secondary | ICD-10-CM | POA: Diagnosis not present

## 2021-05-17 DIAGNOSIS — J410 Simple chronic bronchitis: Secondary | ICD-10-CM | POA: Diagnosis not present

## 2021-05-17 DIAGNOSIS — I10 Essential (primary) hypertension: Secondary | ICD-10-CM

## 2021-05-17 DIAGNOSIS — I739 Peripheral vascular disease, unspecified: Secondary | ICD-10-CM

## 2021-05-17 DIAGNOSIS — I4819 Other persistent atrial fibrillation: Secondary | ICD-10-CM

## 2021-05-17 MED ORDER — APIXABAN 2.5 MG PO TABS: 2.5000 mg | ORAL_TABLET | Freq: Two times a day (BID) | ORAL | 3 refills | Status: DC

## 2021-05-17 NOTE — Patient Instructions (Signed)
Medication Instructions:  ?Your physician has recommended you make the following change in your medication:  ? ?STOP: Aspirin ?STOP: Plavix ?START: Eliquis 2.5 mg twice daily ? ?*If you need a refill on your cardiac medications before your next appointment, please call your pharmacy* ? ? ?Lab Work: ?None ?If you have labs (blood work) drawn today and your tests are completely normal, you will receive your results only by: ?MyChart Message (if you have MyChart) OR ?A paper copy in the mail ?If you have any lab test that is abnormal or we need to change your treatment, we will call you to review the results. ? ? ?Testing/Procedures: ?Your physician has requested that you have an echocardiogram. Echocardiography is a painless test that uses sound waves to create images of your heart. It provides your doctor with information about the size and shape of your heart and how well your heart?s chambers and valves are working. This procedure takes approximately one hour. There are no restrictions for this procedure. ? ? ? ?Follow-Up: ?At [facility], you and your health needs are our priority.  As part of our continuing mission to provide you with exceptional heart care, we have created designated Provider Care Teams.  These Care Teams include your primary Cardiologist (physician) and Advanced Practice Providers (APPs -  Physician Assistants and Nurse Practitioners) who all work together to provide you with the care you need, when you need it. ? ?We recommend signing up for the patient portal called "MyChart".  Sign up information is provided on this After Visit Summary.  MyChart is used to connect with patients for Virtual Visits (Telemedicine).  Patients are able to view lab/test results, encounter notes, upcoming appointments, etc.  Non-urgent messages can be sent to your provider as well.   ?To learn more about what you can do with MyChart, go to [email or web address].   ? ?Your next appointment:   ?4 week(s) ? ?The  format for your next appointment:   ?In Person ? ?Provider:   ?Jenne Campus, MD  ? ? ?Other Instructions ?None ? ?

## 2021-05-17 NOTE — Progress Notes (Signed)
Cardiology Consultation:    Date:  05/17/2021   ID:  Molly Middleton, DOB [birth year], MRN [id number]  PCP:  Lowella Dandy, NP  Cardiologist:  Jenne Campus, MD   Referring MD: Lowella Dandy, NP   Chief Complaint  Patient presents with   Abnormal ECG          History of Present Illness:    Molly Middleton is a 81 y.o. female who is being seen today for the evaluation of atrial fibrillation at the request of Moon, Amy A, NP.  Molly Middleton with a very complex past medical history.  She does have advanced COPD.  She tells me that she started smoking she was for she smokes about 1-1/2 pack/day and still ongoing.  She also have history of hyperlipidemia, obviously COPD, gastroesophageal reflux disease, history of lung cancer, history of breast cancer, dementia, peripheral vascular disease apparently years ago she did have stenting done in arteries of lower extremities.  She was referred to me because when she went to her primary care physician EKG has been performed which showed a evidence of atrial fibrillation.  Molly Middleton is completely unaware of this.  She comes to my office today with 2 of her family members and we talking a lot about her condition.  She denies have any chest pain tightness squeezing pressure burning chest.  Apparently she can walk quite fast she has no difficulty doing it.  There is no swelling of lower extremities she does have some cough.  She never had any heart trouble.  She does have peripheral vascular disease with stenting done many years ago.  Past Medical History:  Diagnosis Date   Allergic rhinitis    Breast cancer (HCC)    COPD (chronic obstructive pulmonary disease) (HCC)    GERD (gastroesophageal reflux disease)    Hyperlipemia    Hypertension    Hypokalemia    Hypothyroid    Osteoporosis    Peptic ulcer disease    PVD (peripheral vascular disease) (HCC)     Past Surgical History:  Procedure Laterality Date   BREAST LUMPECTOMY     EYE SURGERY     ROTATOR CUFF  REPAIR     tubal lig      Current Medications: Current Meds  Medication Sig   albuterol (PROVENTIL HFA;VENTOLIN HFA) 108 (90 BASE) MCG/ACT inhaler Inhale 2 puffs into the lungs every 4 (four) hours as needed. For shortness of breath   amcinonide (CYCLOCORT) 0.1 % ointment Apply topically 2 (two) times daily.   apixaban (ELIQUIS) 2.5 MG TABS tablet Take 1 tablet (2.5 mg total) by mouth 2 (two) times daily.   atenolol-chlorthalidone (TENORETIC) 50-25 MG per tablet Take 1 tablet by mouth every morning.    celecoxib (CELEBREX) 200 MG capsule    cholecalciferol (VITAMIN D) 1000 UNITS tablet Take 1,000 Units by mouth 2 (two) times daily.   diphenhydramine-acetaminophen (TYLENOL PM) 25-500 MG TABS Take 2 tablets by mouth at bedtime as needed. For pain   EMGALITY 120 MG/ML SOAJ Inject 1 mL into the skin every 30 (thirty) days.   levofloxacin (LEVAQUIN) 750 MG tablet Take 1 tablet by mouth daily.   levothyroxine (SYNTHROID, LEVOTHROID) 88 MCG tablet Take 88 mcg by mouth daily. [name] is her MD.   mometasone (NASONEX) 50 MCG/ACT nasal spray Place 2 sprays into the nose daily.   niacin (NIASPAN) 1000 MG CR tablet Take 1,000 mg by mouth at bedtime.   omeprazole (PRILOSEC) 20 MG capsule Take 20  mg by mouth daily.   ondansetron (ZOFRAN) 4 MG tablet Take 4 mg by mouth every 6 (six) hours.   polyethylene glycol powder (GLYCOLAX/MIRALAX) powder    potassium chloride (MICRO-K) 10 MEQ CR capsule    raloxifene (EVISTA) 60 MG tablet Take 60 mg by mouth every morning.    simvastatin (ZOCOR) 80 MG tablet Take 80 mg by mouth at bedtime.   tiotropium (SPIRIVA) 18 MCG inhalation capsule Place 18 mcg into inhaler and inhale daily.   [DISCONTINUED] aspirin EC 81 MG tablet Take 81 mg by mouth daily.   [DISCONTINUED] ELIQUIS 5 MG TABS tablet Take 5 mg by mouth 2 (two) times daily.     Allergies:   Advil [ibuprofen]   Social History   Socioeconomic History   Marital status: Widowed    Spouse name: Not on file    Number of children: Not on file   Years of education: Not on file   Highest education level: Not on file  Occupational History   Not on file  Tobacco Use   Smoking status: Former    Packs/day: 1.00    Types: Cigarettes    Quit date: 02/15/2012    Years since quitting: 9.2   Smokeless tobacco: Never   Tobacco comments:    Currently smoking 2-3 cig per day.  Substance and Sexual Activity   Alcohol use: No   Drug use: No   Sexual activity: Not on file  Other Topics Concern   Not on file  Social History Narrative   Not on file   Social Determinants of Health   Financial Resource Strain: Not on file  Food Insecurity: Not on file  Transportation Needs: Not on file  Physical Activity: Not on file  Stress: Not on file  Social Connections: Not on file     Family History: The patient's family history includes Heart disease in her father; Stroke in her mother. ROS:   Please see the history of present illness.    All 14 point review of systems negative except as described per history of present illness.  EKGs/Labs/Other Studies Reviewed:    The following studies were reviewed today:   EKG:  EKG is  ordered today.  The ekg ordered today demonstrates atrial fibrillation with controlled ventricular rate, intraventricular conduction delay left bundle branch block type, cannot rule out anteroseptal wall MI.  Recent Labs: No results found for requested labs within last 8760 hours.  Recent Lipid Panel No results found for: CHOL, TRIG, HDL, CHOLHDL, VLDL, LDLCALC, LDLDIRECT  Physical Exam:    VS:  BP (!) 120/100 (BP Location: Left Arm, Patient Position: Sitting)    Pulse 69    Ht 5\' 4"  (1.626 m)    Wt 121 lb (54.9 kg)    SpO2 94%    BMI 20.77 kg/m     Wt Readings from Last 3 Encounters:  05/17/21 121 lb (54.9 kg)  02/27/12 116 lb (52.6 kg)  02/20/12 117 lb (53.1 kg)     GEN:  Well nourished, well developed in no acute distress HEENT: Normal NECK: No JVD; No carotid  bruits LYMPHATICS: No lymphadenopathy CARDIAC: Irregular irregular, tones are very distant, no murmurs, no rubs, no gallops RESPIRATORY: Poor air entry bilaterally bilateral wheezes and rhonchi ABDOMEN: Soft, non-tender, non-distended MUSCULOSKELETAL:  No edema; No deformity  SKIN: Warm and dry NEUROLOGIC:  Alert and oriented x 3 PSYCHIATRIC:  Normal affect  Lower extremities: I cannot palpate any pulses  ASSESSMENT:    1. PVD (  peripheral vascular disease) ([place])   2. Persistent atrial fibrillation ([place])   3. Primary hypertension   4. Simple chronic bronchitis ([place])   5. Adenocarcinoma of lung, unspecified laterality ([place])    PLAN:    In order of problems listed above:  Persistent atrial fibrillation.  The truth is that he did not know duration of this phenomenon.  She was given already anticoagulation by her primary care physician which is an excellent idea since her CHADS2 Vascor equals 5 and she is high risk of stroke.  However the dose for her should be 2.5 mg twice daily since she weighs less than 60 kg and she is more than 81 years old.  I will give her samples of 2.5 twice daily and send prescription for it.  She will be scheduled to have an echocardiogram to assess left ventricle ejection fraction.  Also will look at the size of both atria's. Essential hypertension, her blood pressure today is slightly on the higher side but this is first visit to my office therefore we will simply monitor this and see what will measure next time when she be here. Advanced COPD obviously related to smoking.  I told her she need to quit she told me straight that she doubts she will be able to do it. Dyslipidemia I did review her K PN which only LDL of 100 HDL 61 this is from February 2023.  With her peripheral vascular disease I think that need to be better controlled.  She is already on simvastatin 80.  We will talk to her When I see her about potentially switching her to Crestor. Advanced  peripheral vascular disease.  In the future we will consider doing cardiac ultrasound.  I did not feel pulses in her lower extremities.  However there is no open wound there.  And she denies have any claudications.   Medication Adjustments/Labs and Tests Ordered: Current medicines are reviewed at length with the patient today.  Concerns regarding medicines are outlined above.  Orders Placed This Encounter  Procedures   EKG 12-Lead   ECHOCARDIOGRAM COMPLETE   Meds ordered this encounter  Medications   apixaban (ELIQUIS) 2.5 MG TABS tablet    Sig: Take 1 tablet (2.5 mg total) by mouth 2 (two) times daily.    Dispense:  180 tablet    Refill:  3    Signed, Park Liter, MD, [facility]. 05/17/2021 3:54 PM    [place]

## 2021-05-17 NOTE — Progress Notes (Signed)
echo

## 2021-05-23 ENCOUNTER — Ambulatory Visit (INDEPENDENT_AMBULATORY_CARE_PROVIDER_SITE_OTHER): Payer: Medicare HMO

## 2021-05-23 ENCOUNTER — Other Ambulatory Visit: Payer: Self-pay

## 2021-05-23 DIAGNOSIS — I4891 Unspecified atrial fibrillation: Secondary | ICD-10-CM | POA: Diagnosis not present

## 2021-05-23 DIAGNOSIS — I739 Peripheral vascular disease, unspecified: Secondary | ICD-10-CM

## 2021-05-23 LAB — ECHOCARDIOGRAM COMPLETE
Area-P 1/2: 3.34 cm2
Calc EF: 38.4 %
S' Lateral: 3.3 cm
Single Plane A2C EF: 34.6 %
Single Plane A4C EF: 38 %

## 2021-05-25 ENCOUNTER — Telehealth: Payer: Self-pay | Admitting: Cardiology

## 2021-05-25 DIAGNOSIS — E538 Deficiency of other specified B group vitamins: Secondary | ICD-10-CM | POA: Diagnosis not present

## 2021-05-25 DIAGNOSIS — R413 Other amnesia: Secondary | ICD-10-CM | POA: Diagnosis not present

## 2021-05-25 DIAGNOSIS — G43009 Migraine without aura, not intractable, without status migrainosus: Secondary | ICD-10-CM | POA: Diagnosis not present

## 2021-05-25 DIAGNOSIS — Z79899 Other long term (current) drug therapy: Secondary | ICD-10-CM | POA: Diagnosis not present

## 2021-05-25 NOTE — Telephone Encounter (Signed)
Reviewed echo results with daughter Freda Munro ([place] per [facility]).  All questions, if any were answered at the time of the call. ? ? ?

## 2021-05-25 NOTE — Telephone Encounter (Signed)
Patient daughter calling for echo results. She states the patient and her partner don't have good memory and so she would like a call back of the results. ?

## 2021-05-25 NOTE — Telephone Encounter (Signed)
Park Liter, MD  ?05/23/2021 12:18 PM EST   ?  ?Echocardiogram showed low normal ejection fraction and left ventricle hypertrophy, moderate mitral valve regurgitation, moderately enlarged left atrium  ? ?

## 2021-06-07 ENCOUNTER — Telehealth: Payer: Self-pay | Admitting: Cardiology

## 2021-06-07 ENCOUNTER — Telehealth: Payer: Self-pay

## 2021-06-07 ENCOUNTER — Other Ambulatory Visit: Payer: Self-pay

## 2021-06-07 NOTE — Telephone Encounter (Signed)
Molly Middleton returning call in regards to echo results. Please advise. ?

## 2021-06-07 NOTE — Telephone Encounter (Signed)
-----   Message from Park Liter, MD sent at 05/23/2021 12:18 PM EST ----- ?Echocardiogram showed low normal ejection fraction and left ventricle hypertrophy, moderate mitral valve regurgitation, moderately enlarged left atrium ?

## 2021-06-07 NOTE — Telephone Encounter (Signed)
Spoke with Henrene Pastor, notified of the following. He states patient does not want to take Eliquis because she been experiencing drowsy and headaches. Asked if she could d/c this. Patient was also prescribed donepezil 10mg  qd. She has not been taking this either for the same reason.. Message sent to Dr. Raliegh Ip for  further instructions.  ?

## 2021-06-07 NOTE — Telephone Encounter (Signed)
Message addressed, awaiting Dr. Raliegh Ip response  ?

## 2021-06-07 NOTE — Telephone Encounter (Signed)
Molly Middleton is returning Javanna's call in regards to the patient's Echo results.  ?

## 2021-06-08 DIAGNOSIS — I4891 Unspecified atrial fibrillation: Secondary | ICD-10-CM | POA: Diagnosis not present

## 2021-06-08 DIAGNOSIS — R0989 Other specified symptoms and signs involving the circulatory and respiratory systems: Secondary | ICD-10-CM | POA: Diagnosis not present

## 2021-06-08 DIAGNOSIS — I771 Stricture of artery: Secondary | ICD-10-CM | POA: Diagnosis not present

## 2021-06-08 DIAGNOSIS — R519 Headache, unspecified: Secondary | ICD-10-CM | POA: Diagnosis not present

## 2021-06-08 DIAGNOSIS — I739 Peripheral vascular disease, unspecified: Secondary | ICD-10-CM | POA: Diagnosis not present

## 2021-06-19 ENCOUNTER — Ambulatory Visit: Payer: Medicare HMO | Admitting: Cardiology

## 2021-06-19 ENCOUNTER — Encounter: Payer: Self-pay | Admitting: Cardiology

## 2021-06-19 VITALS — BP 82/60 | HR 67 | Ht 63.0 in | Wt 120.6 lb

## 2021-06-19 DIAGNOSIS — J410 Simple chronic bronchitis: Secondary | ICD-10-CM | POA: Diagnosis not present

## 2021-06-19 DIAGNOSIS — I739 Peripheral vascular disease, unspecified: Secondary | ICD-10-CM | POA: Diagnosis not present

## 2021-06-19 DIAGNOSIS — K219 Gastro-esophageal reflux disease without esophagitis: Secondary | ICD-10-CM

## 2021-06-19 DIAGNOSIS — I10 Essential (primary) hypertension: Secondary | ICD-10-CM

## 2021-06-19 DIAGNOSIS — I4819 Other persistent atrial fibrillation: Secondary | ICD-10-CM

## 2021-06-19 NOTE — Progress Notes (Signed)
?Cardiology Office Note:   ? ?Date:  06/19/2021  ? ?ID:  Molly Middleton, DOB Sep 10, 1940, MRN [id number] ? ?PCP:  Lowella Dandy, NP  ?Cardiologist:  Jenne Campus, MD   ? ?Referring MD: Lowella Dandy, NP  ? ?Chief Complaint  ?Patient presents with  ? Results  ?  Echo  ?I am doing fine ? ?History of Present Illness:   ? ?Molly Middleton is a 81 y.o. female with past medical history significant for long-term chronic smoking, COPD, persistent atrial fibrillation, she is anticoagulated, essential hypertension, dyslipidemia.  She is in my office today to discuss results of her test.  She did have echocardiogram done which showed lower limits of normal ejection fraction with biatrial moderate enlargement.  We initiated conversation about potentially cardioversion we discussed all options and the situation meaning we discussed rhythm strategy versus rate control strategy.  She said she would like to try to be cardioverted.  I expressed some concern that they had about successful cardioversion and more importantly keeping her in sinus rhythm and because of multiple comorbidities namely advanced COPD it will be difficult to choose medications that we will maintain her in sinus rhythm.  Regardless we will do EKG today if EKG still show atrial fibrillation give her another 2 months to see if she can convert by his own if not we will talk about potential cardioversion. ? ?Past Medical History:  ?Diagnosis Date  ? Allergic rhinitis   ? Breast cancer ([place])   ? COPD (chronic obstructive pulmonary disease) ([place])   ? GERD (gastroesophageal reflux disease)   ? Hyperlipemia   ? Hypertension   ? Hypokalemia   ? Hypothyroid   ? Osteoporosis   ? Peptic ulcer disease   ? PVD (peripheral vascular disease) ([place])   ? ? ?Past Surgical History:  ?Procedure Laterality Date  ? BREAST LUMPECTOMY    ? EYE SURGERY    ? ROTATOR CUFF REPAIR    ? tubal lig    ? ? ?Current Medications: ?Current Meds  ?Medication Sig  ? albuterol (PROVENTIL HFA;VENTOLIN  HFA) 108 (90 BASE) MCG/ACT inhaler Inhale 2 puffs into the lungs every 4 (four) hours as needed. For shortness of breath  ? amcinonide (CYCLOCORT) 0.1 % ointment Apply topically 2 (two) times daily.  ? apixaban (ELIQUIS) 2.5 MG TABS tablet Take 1 tablet (2.5 mg total) by mouth 2 (two) times daily.  ? atenolol-chlorthalidone (TENORETIC) 50-25 MG per tablet Take 1 tablet by mouth every morning.   ? celecoxib (CELEBREX) 200 MG capsule   ? cholecalciferol (VITAMIN D) 1000 UNITS tablet Take 1,000 Units by mouth 2 (two) times daily.  ? diphenhydramine-acetaminophen (TYLENOL PM) 25-500 MG TABS Take 2 tablets by mouth at bedtime as needed. For pain  ? EMGALITY 120 MG/ML SOAJ Inject 1 mL into the skin every 30 (thirty) days.  ? levofloxacin (LEVAQUIN) 750 MG tablet Take 1 tablet by mouth daily.  ? levothyroxine (SYNTHROID, LEVOTHROID) 88 MCG tablet Take 88 mcg by mouth daily. [name] is her MD.  ? mometasone (NASONEX) 50 MCG/ACT nasal spray Place 2 sprays into the nose daily.  ? niacin (NIASPAN) 1000 MG CR tablet Take 1,000 mg by mouth at bedtime.  ? omeprazole (PRILOSEC) 20 MG capsule Take 20 mg by mouth daily.  ? ondansetron (ZOFRAN) 4 MG tablet Take 4 mg by mouth every 6 (six) hours.  ? polyethylene glycol powder (GLYCOLAX/MIRALAX) powder   ? potassium chloride (MICRO-K) 10 MEQ CR capsule   ?  raloxifene (EVISTA) 60 MG tablet Take 60 mg by mouth every morning.   ? simvastatin (ZOCOR) 80 MG tablet Take 80 mg by mouth at bedtime.  ? tiotropium (SPIRIVA) 18 MCG inhalation capsule Place 18 mcg into inhaler and inhale daily.  ?  ? ?Allergies:   Advil [ibuprofen]  ? ?Social History  ? ?Socioeconomic History  ? Marital status: Widowed  ?  Spouse name: Not on file  ? Number of children: Not on file  ? Years of education: Not on file  ? Highest education level: Not on file  ?Occupational History  ? Not on file  ?Tobacco Use  ? Smoking status: Former  ?  Packs/day: 1.00  ?  Types: Cigarettes  ?  Quit date: 02/15/2012  ?  Years  since quitting: 9.3  ? Smokeless tobacco: Never  ? Tobacco comments:  ?  Currently smoking 2-3 cig per day.  ?Substance and Sexual Activity  ? Alcohol use: No  ? Drug use: No  ? Sexual activity: Not on file  ?Other Topics Concern  ? Not on file  ?Social History Narrative  ? Not on file  ? ?Social Determinants of Health  ? ?Financial Resource Strain: Not on file  ?Food Insecurity: Not on file  ?Transportation Needs: Not on file  ?Physical Activity: Not on file  ?Stress: Not on file  ?Social Connections: Not on file  ?  ? ?Family History: ?The patient's family history includes Heart disease in her father; Stroke in her mother. ?ROS:   ?Please see the history of present illness.    ?All 14 point review of systems negative except as described per history of present illness ? ?EKGs/Labs/Other Studies Reviewed:   ? ? ? ?Recent Labs: ?No results found for requested labs within last 8760 hours.  ?Recent Lipid Panel ?No results found for: CHOL, TRIG, HDL, CHOLHDL, VLDL, LDLCALC, LDLDIRECT ? ?Physical Exam:   ? ?VS:  BP (!) 82/60 (BP Location: Left Arm, Patient Position: Sitting)   Pulse 67   Ht 5\' 3"  (1.6 m)   Wt 120 lb 9.6 oz (54.7 kg)   SpO2 92%   BMI 21.36 kg/m?    ? ?Wt Readings from Last 3 Encounters:  ?06/19/21 120 lb 9.6 oz (54.7 kg)  ?05/17/21 121 lb (54.9 kg)  ?02/27/12 116 lb (52.6 kg)  ?  ? ?GEN:  Well nourished, well developed in no acute distress ?HEENT: Normal ?NECK: No JVD; No carotid bruits ?LYMPHATICS: No lymphadenopathy ?CARDIAC: Irregular irregular but auscultation is difficult because of multiple wheezes, no murmurs, no rubs, no gallops ?RESPIRATORY: Multiple wheezes both sides ?ABDOMEN: Soft, non-tender, non-distended ?MUSCULOSKELETAL:  No edema; No deformity  ?SKIN: Warm and dry ?LOWER EXTREMITIES: no swelling ?NEUROLOGIC:  Alert and oriented x 3 ?PSYCHIATRIC:  Normal affect  ? ?ASSESSMENT:   ? ?1. Persistent atrial fibrillation ([place])   ?2. Primary hypertension   ?3. PVD (peripheral vascular  disease) ([place])   ?4. Simple chronic bronchitis ([place])   ?5. Gastroesophageal reflux disease without esophagitis   ? ?PLAN:   ? ?In order of problems listed above: ? ?Persistent atrial fibrillation rate controlled.  We will do EKG today.  We will continue anticoagulation. ?Essential hypertension: Her blood pressure is low today she is doing perfectly fine she is asymptomatic.  Will continue monitoring if in the future blood pressure will be a problem we will discontinue chlorthalidone. ?Peripheral vascular disease she does have advanced COPD and she still smokes.  I will schedule her to have carotic  ultrasound. ?GERD: Stable ? ? ?Medication Adjustments/Labs and Tests Ordered: ?Current medicines are reviewed at length with the patient today.  Concerns regarding medicines are outlined above.  ?No orders of the defined types were placed in this encounter. ? ?Medication changes: No orders of the defined types were placed in this encounter. ? ? ?Signed, ?Park Liter, MD, [place] ?06/19/2021 10:33 AM    ?[place] ?

## 2021-06-19 NOTE — Addendum Note (Signed)
Addended by: Jerl Santos R on: 06/19/2021 12:00 PM ? ? Modules accepted: Orders ? ?

## 2021-06-19 NOTE — Patient Instructions (Signed)
Medication Instructions:  ?Your physician recommends that you continue on your current medications as directed. Please refer to the Current Medication list given to you today. ? ?*If you need a refill on your cardiac medications before your next appointment, please call your pharmacy* ? ? ?Lab Work: ?None ?If you have labs (blood work) drawn today and your tests are completely normal, you will receive your results only by: ?MyChart Message (if you have MyChart) OR ?A paper copy in the mail ?If you have any lab test that is abnormal or we need to change your treatment, we will call you to review the results. ? ? ?Testing/Procedures: ?Your physician has requested that you have a carotid duplex. This test is an ultrasound of the carotid arteries in your neck. It looks at blood flow through these arteries that supply the brain with blood. Allow one hour for this exam. There are no restrictions or special instructions.  ? ? ?Follow-Up: ?At [facility], you and your health needs are our priority.  As part of our continuing mission to provide you with exceptional heart care, we have created designated Provider Care Teams.  These Care Teams include your primary Cardiologist (physician) and Advanced Practice Providers (APPs -  Physician Assistants and Nurse Practitioners) who all work together to provide you with the care you need, when you need it. ? ?We recommend signing up for the patient portal called "MyChart".  Sign up information is provided on this After Visit Summary.  MyChart is used to connect with patients for Virtual Visits (Telemedicine).  Patients are able to view lab/test results, encounter notes, upcoming appointments, etc.  Non-urgent messages can be sent to your provider as well.   ?To learn more about what you can do with MyChart, go to [email or web address].   ? ?Your next appointment:   ?2 month(s) ? ?The format for your next appointment:   ?In Person ? ?Provider:   ?Jenne Campus, MD   ? ? ?Other Instructions ?None  ?

## 2021-06-19 NOTE — Addendum Note (Signed)
Addended by: Edwyna Shell I on: 06/19/2021 10:46 AM ? ? Modules accepted: Orders ? ?

## 2021-06-25 ENCOUNTER — Ambulatory Visit (INDEPENDENT_AMBULATORY_CARE_PROVIDER_SITE_OTHER): Payer: Medicare HMO

## 2021-06-25 DIAGNOSIS — J410 Simple chronic bronchitis: Secondary | ICD-10-CM

## 2021-06-25 DIAGNOSIS — I4819 Other persistent atrial fibrillation: Secondary | ICD-10-CM | POA: Diagnosis not present

## 2021-06-25 DIAGNOSIS — I6523 Occlusion and stenosis of bilateral carotid arteries: Secondary | ICD-10-CM | POA: Diagnosis not present

## 2021-06-25 DIAGNOSIS — I739 Peripheral vascular disease, unspecified: Secondary | ICD-10-CM

## 2021-06-25 DIAGNOSIS — K219 Gastro-esophageal reflux disease without esophagitis: Secondary | ICD-10-CM | POA: Diagnosis not present

## 2021-06-25 DIAGNOSIS — I10 Essential (primary) hypertension: Secondary | ICD-10-CM

## 2021-07-02 ENCOUNTER — Telehealth: Payer: Self-pay

## 2021-07-02 NOTE — Telephone Encounter (Signed)
Follow Up: ? ? ? ?Patient's daughter is returning Lisa's call from Friday, concerning her lab results.  ?

## 2021-07-24 DIAGNOSIS — R21 Rash and other nonspecific skin eruption: Secondary | ICD-10-CM | POA: Diagnosis not present

## 2021-07-24 DIAGNOSIS — B355 Tinea imbricata: Secondary | ICD-10-CM | POA: Diagnosis not present

## 2021-08-27 ENCOUNTER — Ambulatory Visit: Payer: Medicare HMO | Admitting: Cardiology

## 2021-09-07 DIAGNOSIS — I4891 Unspecified atrial fibrillation: Secondary | ICD-10-CM | POA: Diagnosis not present

## 2021-09-07 DIAGNOSIS — Z1331 Encounter for screening for depression: Secondary | ICD-10-CM | POA: Diagnosis not present

## 2021-09-07 DIAGNOSIS — Z9181 History of falling: Secondary | ICD-10-CM | POA: Diagnosis not present

## 2021-09-07 DIAGNOSIS — I10 Essential (primary) hypertension: Secondary | ICD-10-CM | POA: Diagnosis not present

## 2021-09-07 DIAGNOSIS — J449 Chronic obstructive pulmonary disease, unspecified: Secondary | ICD-10-CM | POA: Diagnosis not present

## 2021-09-07 DIAGNOSIS — F09 Unspecified mental disorder due to known physiological condition: Secondary | ICD-10-CM | POA: Diagnosis not present

## 2021-09-07 DIAGNOSIS — E538 Deficiency of other specified B group vitamins: Secondary | ICD-10-CM | POA: Diagnosis not present

## 2021-09-07 DIAGNOSIS — I739 Peripheral vascular disease, unspecified: Secondary | ICD-10-CM | POA: Diagnosis not present

## 2021-09-07 DIAGNOSIS — E785 Hyperlipidemia, unspecified: Secondary | ICD-10-CM | POA: Diagnosis not present

## 2021-09-25 DIAGNOSIS — E538 Deficiency of other specified B group vitamins: Secondary | ICD-10-CM | POA: Diagnosis not present

## 2021-09-25 DIAGNOSIS — G43909 Migraine, unspecified, not intractable, without status migrainosus: Secondary | ICD-10-CM | POA: Diagnosis not present

## 2021-09-25 DIAGNOSIS — R413 Other amnesia: Secondary | ICD-10-CM | POA: Diagnosis not present

## 2021-09-25 DIAGNOSIS — Z79899 Other long term (current) drug therapy: Secondary | ICD-10-CM | POA: Diagnosis not present

## 2021-12-07 ENCOUNTER — Ambulatory Visit: Payer: Medicare HMO | Attending: Cardiology | Admitting: Cardiology

## 2021-12-07 VITALS — BP 118/80 | HR 60 | Ht 65.0 in | Wt 126.0 lb

## 2021-12-07 DIAGNOSIS — J410 Simple chronic bronchitis: Secondary | ICD-10-CM | POA: Diagnosis not present

## 2021-12-07 DIAGNOSIS — I4821 Permanent atrial fibrillation: Secondary | ICD-10-CM

## 2021-12-07 DIAGNOSIS — C3490 Malignant neoplasm of unspecified part of unspecified bronchus or lung: Secondary | ICD-10-CM | POA: Diagnosis not present

## 2021-12-07 DIAGNOSIS — I10 Essential (primary) hypertension: Secondary | ICD-10-CM

## 2021-12-07 NOTE — Patient Instructions (Addendum)
Medication Instructions:  Your physician has recommended you make the following change in your medication:   DECREASE: Tenoretic to 1/2 tablet daily   (atenolol-chlorthalidone (TENORETIC) 50-25 MG per tablet)  Lab Work: None Ordered If you have labs (blood work) drawn today and your tests are completely normal, you will receive your results only by: [employer] (if you have MyChart) OR A paper copy in the mail If you have any lab test that is abnormal or we need to change your treatment, we will call you to review the results.   Testing/Procedures: None Ordered   Follow-Up: At [facility], you and your health needs are our priority.  As part of our continuing mission to provide you with exceptional heart care, we have created designated Provider Care Teams.  These Care Teams include your primary Cardiologist (physician) and Advanced Practice Providers (APPs -  Physician Assistants and Nurse Practitioners) who all work together to provide you with the care you need, when you need it.  We recommend signing up for the patient portal called "MyChart".  Sign up information is provided on this After Visit Summary.  MyChart is used to connect with patients for Virtual Visits (Telemedicine).  Patients are able to view lab/test results, encounter notes, upcoming appointments, etc.  Non-urgent messages can be sent to your provider as well.   To learn more about what you can do with MyChart, go to [email or web address].    Your next appointment:   5 month(s)  The format for your next appointment:   In Person  Provider:   Jenne Campus, MD    Other Instructions NA

## 2021-12-07 NOTE — Progress Notes (Unsigned)
Cardiology Office Note:    Date:  12/07/2021   ID:  COURTLYN AKI, DOB 1941/02/07, MRN [id number]  PCP:  Lowella Dandy, NP  Cardiologist:  Jenne Campus, MD    Referring MD: Lowella Dandy, NP   Chief Complaint  Patient presents with   Follow-up  Doing fine  History of Present Illness:    Molly Middleton is a 81 y.o. female with past medical history significant for long-term smoking she still continues to smoke, advanced COPD, lung cancer, permanent atrial fibrillation she is anticoagulated, essential hypertension, dyslipidemia.  She was referred to my office because of episode of atrial fibrillation.  We did echocardiogram showing normal ejection fraction moderate biatrial enlargement.  She is coming here today to my office to discuss options for this situation we discussed pros and cons of rhythm versus rate control strategy.  She prefers to proceed with rate control strategy.  Overall doing well denies have any palpitations some weakness fatigue and tiredness.  Past Medical History:  Diagnosis Date   Allergic rhinitis    Breast cancer (HCC)    COPD (chronic obstructive pulmonary disease) (HCC)    GERD (gastroesophageal reflux disease)    Hyperlipemia    Hypertension    Hypokalemia    Hypothyroid    Osteoporosis    Peptic ulcer disease    PVD (peripheral vascular disease) (HCC)     Past Surgical History:  Procedure Laterality Date   BREAST LUMPECTOMY     EYE SURGERY     ROTATOR CUFF REPAIR     tubal lig      Current Medications: Current Meds  Medication Sig   albuterol (PROVENTIL HFA;VENTOLIN HFA) 108 (90 BASE) MCG/ACT inhaler Inhale 2 puffs into the lungs every 4 (four) hours as needed. For shortness of breath   amcinonide (CYCLOCORT) 0.1 % ointment Apply topically 2 (two) times daily.   apixaban (ELIQUIS) 2.5 MG TABS tablet Take 1 tablet (2.5 mg total) by mouth 2 (two) times daily.   atenolol-chlorthalidone (TENORETIC) 50-25 MG per tablet Take 1 tablet by mouth  every morning.    celecoxib (CELEBREX) 200 MG capsule    cholecalciferol (VITAMIN D) 1000 UNITS tablet Take 1,000 Units by mouth 2 (two) times daily.   diphenhydramine-acetaminophen (TYLENOL PM) 25-500 MG TABS Take 2 tablets by mouth at bedtime as needed. For pain   EMGALITY 120 MG/ML SOAJ Inject 1 mL into the skin every 30 (thirty) days.   levofloxacin (LEVAQUIN) 750 MG tablet Take 1 tablet by mouth daily.   levothyroxine (SYNTHROID, LEVOTHROID) 88 MCG tablet Take 88 mcg by mouth daily. [name] is her MD.   mometasone (NASONEX) 50 MCG/ACT nasal spray Place 2 sprays into the nose daily.   niacin (NIASPAN) 1000 MG CR tablet Take 1,000 mg by mouth at bedtime.   omeprazole (PRILOSEC) 20 MG capsule Take 20 mg by mouth daily.   ondansetron (ZOFRAN) 4 MG tablet Take 4 mg by mouth every 6 (six) hours.   polyethylene glycol powder (GLYCOLAX/MIRALAX) powder    potassium chloride (MICRO-K) 10 MEQ CR capsule    raloxifene (EVISTA) 60 MG tablet Take 60 mg by mouth every morning.    simvastatin (ZOCOR) 80 MG tablet Take 80 mg by mouth at bedtime.   tiotropium (SPIRIVA) 18 MCG inhalation capsule Place 18 mcg into inhaler and inhale daily.     Allergies:   Advil [ibuprofen]   Social History   Socioeconomic History   Marital status: Widowed    Spouse  name: Not on file   Number of children: Not on file   Years of education: Not on file   Highest education level: Not on file  Occupational History   Not on file  Tobacco Use   Smoking status: Former    Packs/day: 1.00    Types: Cigarettes    Quit date: 02/15/2012    Years since quitting: 9.8   Smokeless tobacco: Never   Tobacco comments:    Currently smoking 2-3 cig per day.  Substance and Sexual Activity   Alcohol use: No   Drug use: No   Sexual activity: Not on file  Other Topics Concern   Not on file  Social History Narrative   Not on file   Social Determinants of Health   Financial Resource Strain: Not on file  Food Insecurity:  Not on file  Transportation Needs: Not on file  Physical Activity: Not on file  Stress: Not on file  Social Connections: Not on file     Family History: The patient's family history includes Heart disease in her father; Stroke in her mother. ROS:   Please see the history of present illness.    All 14 point review of systems negative except as described per history of present illness  EKGs/Labs/Other Studies Reviewed:      Recent Labs: No results found for requested labs within last 365 days.  Recent Lipid Panel No results found for: "CHOL", "TRIG", "HDL", "CHOLHDL", "VLDL", "LDLCALC", "LDLDIRECT"  Physical Exam:    VS:  BP 118/80 (BP Location: Left Arm, Patient Position: Sitting)   Pulse 60   Ht 5\' 5"  (1.651 m)   Wt 126 lb (57.2 kg)   SpO2 (!) 89%   BMI 20.97 kg/m     Wt Readings from Last 3 Encounters:  12/07/21 126 lb (57.2 kg)  06/19/21 120 lb 9.6 oz (54.7 kg)  05/17/21 121 lb (54.9 kg)     GEN:  Well nourished, well developed in no acute distress HEENT: Normal NECK: No JVD; No carotid bruits LYMPHATICS: No lymphadenopathy CARDIAC: Even irregular, no murmurs, no rubs, no gallops RESPIRATORY:  Clear to auscultation without rales, wheezing or rhonchi  ABDOMEN: Soft, non-tender, non-distended MUSCULOSKELETAL:  No edema; No deformity  SKIN: Warm and dry LOWER EXTREMITIES: no swelling NEUROLOGIC:  Alert and oriented x 3 PSYCHIATRIC:  Normal affect   ASSESSMENT:    1. Primary hypertension   2. Permanent atrial fibrillation ([place])   3. Simple chronic bronchitis ([place])   4. Adenocarcinoma of lung, unspecified laterality ([place])    PLAN:    In order of problems listed above:  Permanent atrial fibrillation we had a long discussion with her as well as with the family member.  With her advanced COPD, history of lung cancer I think it would be quite risky to do cardioversion on her on top of that she is completely absolutely asymptomatic.  We will continue  anticoagulation.  I will cut down her Tenoretic and a half since she is bradycardic and also blood pressure is on the low side. COPD: Sadly she still continues to smoke.  She told me straight that she is not interested in quitting she said she has been smoking for more than 60 years and she is not interested in quitting. Dyslipidemia I did review her K PN which show me her LDL of 77 HDL 60 this is from summer of this year she is taking moderate intensity statin form of Zocor 80 which I will continue.  Medication Adjustments/Labs and Tests Ordered: Current medicines are reviewed at length with the patient today.  Concerns regarding medicines are outlined above.  No orders of the defined types were placed in this encounter.  Medication changes: No orders of the defined types were placed in this encounter.   Signed, Park Liter, MD, [facility] 12/07/2021 1:52 PM    [place]

## 2022-01-11 DIAGNOSIS — E785 Hyperlipidemia, unspecified: Secondary | ICD-10-CM | POA: Diagnosis not present

## 2022-01-11 DIAGNOSIS — J449 Chronic obstructive pulmonary disease, unspecified: Secondary | ICD-10-CM | POA: Diagnosis not present

## 2022-01-11 DIAGNOSIS — Z139 Encounter for screening, unspecified: Secondary | ICD-10-CM | POA: Diagnosis not present

## 2022-01-11 DIAGNOSIS — Z23 Encounter for immunization: Secondary | ICD-10-CM | POA: Diagnosis not present

## 2022-01-11 DIAGNOSIS — E538 Deficiency of other specified B group vitamins: Secondary | ICD-10-CM | POA: Diagnosis not present

## 2022-01-11 DIAGNOSIS — I739 Peripheral vascular disease, unspecified: Secondary | ICD-10-CM | POA: Diagnosis not present

## 2022-01-11 DIAGNOSIS — I10 Essential (primary) hypertension: Secondary | ICD-10-CM | POA: Diagnosis not present

## 2022-01-11 DIAGNOSIS — F17200 Nicotine dependence, unspecified, uncomplicated: Secondary | ICD-10-CM | POA: Diagnosis not present

## 2022-01-11 DIAGNOSIS — I4891 Unspecified atrial fibrillation: Secondary | ICD-10-CM | POA: Diagnosis not present

## 2022-03-29 DIAGNOSIS — I10 Essential (primary) hypertension: Secondary | ICD-10-CM | POA: Diagnosis not present

## 2022-03-29 DIAGNOSIS — R519 Headache, unspecified: Secondary | ICD-10-CM | POA: Diagnosis not present

## 2022-05-06 DIAGNOSIS — J449 Chronic obstructive pulmonary disease, unspecified: Secondary | ICD-10-CM | POA: Diagnosis not present

## 2022-05-06 DIAGNOSIS — I4891 Unspecified atrial fibrillation: Secondary | ICD-10-CM | POA: Diagnosis not present

## 2022-05-06 DIAGNOSIS — I10 Essential (primary) hypertension: Secondary | ICD-10-CM | POA: Diagnosis not present

## 2022-05-06 DIAGNOSIS — I739 Peripheral vascular disease, unspecified: Secondary | ICD-10-CM | POA: Diagnosis not present

## 2022-05-06 DIAGNOSIS — E039 Hypothyroidism, unspecified: Secondary | ICD-10-CM | POA: Diagnosis not present

## 2022-05-06 DIAGNOSIS — E785 Hyperlipidemia, unspecified: Secondary | ICD-10-CM | POA: Diagnosis not present

## 2022-05-06 DIAGNOSIS — F09 Unspecified mental disorder due to known physiological condition: Secondary | ICD-10-CM | POA: Diagnosis not present

## 2022-05-06 DIAGNOSIS — Z139 Encounter for screening, unspecified: Secondary | ICD-10-CM | POA: Diagnosis not present

## 2022-05-06 DIAGNOSIS — E538 Deficiency of other specified B group vitamins: Secondary | ICD-10-CM | POA: Diagnosis not present

## 2022-05-06 DIAGNOSIS — D692 Other nonthrombocytopenic purpura: Secondary | ICD-10-CM | POA: Diagnosis not present

## 2022-05-08 DIAGNOSIS — Z09 Encounter for follow-up examination after completed treatment for conditions other than malignant neoplasm: Secondary | ICD-10-CM | POA: Diagnosis not present

## 2022-05-08 DIAGNOSIS — Z8673 Personal history of transient ischemic attack (TIA), and cerebral infarction without residual deficits: Secondary | ICD-10-CM | POA: Diagnosis not present

## 2022-05-08 DIAGNOSIS — F02818 Dementia in other diseases classified elsewhere, unspecified severity, with other behavioral disturbance: Secondary | ICD-10-CM | POA: Diagnosis not present

## 2022-05-23 ENCOUNTER — Encounter: Payer: Self-pay | Admitting: Cardiology

## 2022-05-23 ENCOUNTER — Ambulatory Visit: Payer: Medicare HMO | Attending: Cardiology | Admitting: Cardiology

## 2022-05-23 VITALS — BP 146/98 | HR 76 | Ht 63.0 in | Wt 121.0 lb

## 2022-05-23 DIAGNOSIS — C3490 Malignant neoplasm of unspecified part of unspecified bronchus or lung: Secondary | ICD-10-CM

## 2022-05-23 DIAGNOSIS — J410 Simple chronic bronchitis: Secondary | ICD-10-CM | POA: Diagnosis not present

## 2022-05-23 DIAGNOSIS — I4821 Permanent atrial fibrillation: Secondary | ICD-10-CM

## 2022-05-23 DIAGNOSIS — I739 Peripheral vascular disease, unspecified: Secondary | ICD-10-CM

## 2022-05-23 MED ORDER — EZETIMIBE 10 MG PO TABS: 10.0000 mg | ORAL_TABLET | Freq: Every day | ORAL | 3 refills | Status: DC

## 2022-05-23 NOTE — Patient Instructions (Signed)
Medication Instructions:   START: Zetia '10mg'$  1 tablet daily   Lab Work: None Ordered If you have labs (blood work) drawn today and your tests are completely normal, you will receive your results only by: MyChart Message (if you have MyChart) OR A paper copy in the mail If you have any lab test that is abnormal or we need to change your treatment, we will call you to review the results.   Testing/Procedures: Your physician has requested that you have a carotid duplex. This test is an ultrasound of the carotid arteries in your neck. It looks at blood flow through these arteries that supply the brain with blood. Allow one hour for this exam. There are no restrictions or special instructions.    Follow-Up: At [facility], you and your health needs are our priority.  As part of our continuing mission to provide you with exceptional heart care, we have created designated Provider Care Teams.  These Care Teams include your primary Cardiologist (physician) and Advanced Practice Providers (APPs -  Physician Assistants and Nurse Practitioners) who all work together to provide you with the care you need, when you need it.  We recommend signing up for the patient portal called "MyChart".  Sign up information is provided on this After Visit Summary.  MyChart is used to connect with patients for Virtual Visits (Telemedicine).  Patients are able to view lab/test results, encounter notes, upcoming appointments, etc.  Non-urgent messages can be sent to your provider as well.   To learn more about what you can do with MyChart, go to [email or web address].    Your next appointment:   12 month(s)  The format for your next appointment:   In Person  Provider:   Jenne Campus, MD    Other Instructions NA

## 2022-05-23 NOTE — Progress Notes (Signed)
Cardiology Office Note:    Date:  05/23/2022   ID:  Molly Middleton, DOB 03-11-1941, MRN [id number]  PCP:  Lowella Dandy, NP  Cardiologist:  Jenne Campus, MD    Referring MD: Lowella Dandy, NP   Chief Complaint  Patient presents with   Follow-up  Doing well  History of Present Illness:    Molly Middleton is a 82 y.o. female with past medical history significant for long-term smoking which is still ongoing, lung cancer, advanced COPD, permanent atrial fibrillation she is anticoagulated, essential hypertension, dyslipidemia. She comes today to months for follow-up.  Overall she seems to be doing well.  She lives a mostly sedentary lifestyle but does few things at home.  Denies have any chest pain tightness squeezing pressure burning chest.  Past Medical History:  Diagnosis Date   Allergic rhinitis    Breast cancer (HCC)    COPD (chronic obstructive pulmonary disease) (HCC)    GERD (gastroesophageal reflux disease)    Hyperlipemia    Hypertension    Hypokalemia    Hypothyroid    Osteoporosis    Peptic ulcer disease    PVD (peripheral vascular disease) (HCC)     Past Surgical History:  Procedure Laterality Date   BREAST LUMPECTOMY     EYE SURGERY     ROTATOR CUFF REPAIR     tubal lig      Current Medications: Current Meds  Medication Sig   albuterol (PROVENTIL HFA;VENTOLIN HFA) 108 (90 BASE) MCG/ACT inhaler Inhale 2 puffs into the lungs every 4 (four) hours as needed. For shortness of breath   amcinonide (CYCLOCORT) 0.1 % ointment Apply topically 2 (two) times daily.   apixaban (ELIQUIS) 2.5 MG TABS tablet Take 1 tablet (2.5 mg total) by mouth 2 (two) times daily.   atenolol-chlorthalidone (TENORETIC) 50-25 MG per tablet Take 0.5 tablets by mouth every morning.   celecoxib (CELEBREX) 200 MG capsule    cholecalciferol (VITAMIN D) 1000 UNITS tablet Take 1,000 Units by mouth 2 (two) times daily.   diphenhydramine-acetaminophen (TYLENOL PM) 25-500 MG TABS Take 2 tablets by  mouth at bedtime as needed. For pain   EMGALITY 120 MG/ML SOAJ Inject 1 mL into the skin every 30 (thirty) days.   levofloxacin (LEVAQUIN) 750 MG tablet Take 1 tablet by mouth daily.   levothyroxine (SYNTHROID, LEVOTHROID) 88 MCG tablet Take 88 mcg by mouth daily. Molly Middleton is her MD.   mometasone (NASONEX) 50 MCG/ACT nasal spray Place 2 sprays into the nose daily.   niacin (NIASPAN) 1000 MG CR tablet Take 1,000 mg by mouth at bedtime.   omeprazole (PRILOSEC) 20 MG capsule Take 20 mg by mouth daily.   ondansetron (ZOFRAN) 4 MG tablet Take 4 mg by mouth every 6 (six) hours.   polyethylene glycol powder (GLYCOLAX/MIRALAX) powder    potassium chloride (MICRO-K) 10 MEQ CR capsule    raloxifene (EVISTA) 60 MG tablet Take 60 mg by mouth every morning.    simvastatin (ZOCOR) 80 MG tablet Take 80 mg by mouth at bedtime.     Allergies:   Advil [ibuprofen]   Social History   Socioeconomic History   Marital status: Widowed    Spouse name: Not on file   Number of children: Not on file   Years of education: Not on file   Highest education level: Not on file  Occupational History   Not on file  Tobacco Use   Smoking status: Former    Packs/day: 1.00  Types: Cigarettes    Quit date: 02/15/2012    Years since quitting: 10.2   Smokeless tobacco: Never   Tobacco comments:    Currently smoking 2-3 cig per day.  Substance and Sexual Activity   Alcohol use: No   Drug use: No   Sexual activity: Not on file  Other Topics Concern   Not on file  Social History Narrative   Not on file   Social Determinants of Health   Financial Resource Strain: Not on file  Food Insecurity: Not on file  Transportation Needs: Not on file  Physical Activity: Not on file  Stress: Not on file  Social Connections: Not on file     Family History: The patient's family history includes Heart disease in her father; Stroke in her mother. ROS:   Please see the history of present illness.    All 14 point review  of systems negative except as described per history of present illness  EKGs/Labs/Other Studies Reviewed:      Recent Labs: No results found for requested labs within last 365 days.  Recent Lipid Panel No results found for: "CHOL", "TRIG", "HDL", "CHOLHDL", "VLDL", "LDLCALC", "LDLDIRECT"  Physical Exam:    VS:  BP (!) 146/98 (BP Location: Right Arm, Patient Position: Sitting)   Pulse 76   Ht '5\' 3"'$  (1.6 m)   Wt 121 lb (54.9 kg)   BMI 21.43 kg/m     Wt Readings from Last 3 Encounters:  05/23/22 121 lb (54.9 kg)  12/07/21 126 lb (57.2 kg)  06/19/21 120 lb 9.6 oz (54.7 kg)     GEN:  Well nourished, well developed in no acute distress HEENT: Normal NECK: No JVD; No carotid bruits LYMPHATICS: No lymphadenopathy CARDIAC: Irregularly regular, no murmurs, no rubs, no gallops RESPIRATORY:  Clear to auscultation without rales, wheezing or rhonchi  ABDOMEN: Soft, non-tender, non-distended MUSCULOSKELETAL:  No edema; No deformity  SKIN: Warm and dry LOWER EXTREMITIES: no swelling NEUROLOGIC:  Alert and oriented x 3 PSYCHIATRIC:  Normal affect   ASSESSMENT:    1. Permanent atrial fibrillation ([place])   2. Simple chronic bronchitis ([place])   3. PVD (peripheral vascular disease) ([place])   4. Adenocarcinoma of lung, unspecified laterality ([place])    PLAN:    In order of problems listed above:  Permanent atrial fibrillation rate controlled continue present medication including anticoagulation dose is appropriate to her weight and age. COPD.  Did be followed by antimedicine team. 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  Will ask her to have carotic ultrasounds done within the next few weeks. Dyslipidemia I did review K PN which show me LDL of 87 HDL 50 and this is from February 19.  I asked her to add Zetia to her medical regiment.  Target LDL should be less than 70. Adenocarcinoma of the lung.  Followed by oncology and internal medicine team. Essential  hypertension blood pressure elevated but will recheck before she leaves the room   Medication Adjustments/Labs and Tests Ordered: Current medicines are reviewed at length with the patient today.  Concerns regarding medicines are outlined above.  No orders of the defined types were placed in this encounter.  Medication changes: No orders of the defined types were placed in this encounter.   Signed, Park Liter, MD, [facility] 05/23/2022 8:35 AM    [place]

## 2022-05-29 DIAGNOSIS — R413 Other amnesia: Secondary | ICD-10-CM | POA: Diagnosis not present

## 2022-05-29 DIAGNOSIS — E538 Deficiency of other specified B group vitamins: Secondary | ICD-10-CM | POA: Diagnosis not present

## 2022-05-29 DIAGNOSIS — G43909 Migraine, unspecified, not intractable, without status migrainosus: Secondary | ICD-10-CM | POA: Diagnosis not present

## 2022-05-29 DIAGNOSIS — Z8673 Personal history of transient ischemic attack (TIA), and cerebral infarction without residual deficits: Secondary | ICD-10-CM | POA: Diagnosis not present

## 2022-05-29 DIAGNOSIS — Z79899 Other long term (current) drug therapy: Secondary | ICD-10-CM | POA: Diagnosis not present

## 2022-05-30 ENCOUNTER — Telehealth: Payer: Self-pay | Admitting: Cardiology

## 2022-05-30 ENCOUNTER — Ambulatory Visit: Payer: Medicare HMO | Attending: Cardiology

## 2022-05-30 DIAGNOSIS — I6522 Occlusion and stenosis of left carotid artery: Secondary | ICD-10-CM | POA: Diagnosis not present

## 2022-05-30 DIAGNOSIS — I739 Peripheral vascular disease, unspecified: Secondary | ICD-10-CM

## 2022-05-30 DIAGNOSIS — I4821 Permanent atrial fibrillation: Secondary | ICD-10-CM

## 2022-05-30 NOTE — Telephone Encounter (Signed)
Work note sent to [employer] [phone] for Dan Humphreys- She accompanied pt to appt today

## 2022-05-30 NOTE — Telephone Encounter (Signed)
Pt daughter called in stating she needs a work note stating she with the pt at the carotid artery test and had to miss work for a few. Please advise.    Fax [phone] to [employer] Che[employer] (HR for her job)

## 2022-05-31 NOTE — Telephone Encounter (Signed)
Advised the pt to callback if note was not received by employer.

## 2022-05-31 NOTE — Telephone Encounter (Signed)
Freda Munro is calling wanting to know if fax was able to go through. Please advise.

## 2022-06-04 ENCOUNTER — Telehealth: Payer: Self-pay | Admitting: Cardiology

## 2022-06-04 ENCOUNTER — Telehealth: Payer: Self-pay

## 2022-06-04 NOTE — Telephone Encounter (Signed)
-----   Message from Park Liter, MD sent at 05/30/2022  9:24 PM EDT ----- Carotic arterial duplex showed mild disease bilaterally up to 39%, medical therapy

## 2022-06-04 NOTE — Telephone Encounter (Signed)
Spoke with Freda Munro, notified of results

## 2022-06-04 NOTE — Telephone Encounter (Signed)
Patient's daughter is following up again requesting to speak with Molly Middleton.

## 2022-06-04 NOTE — Telephone Encounter (Signed)
Follow Up:    Daughter is returning [place] call.tggf

## 2022-06-04 NOTE — Telephone Encounter (Signed)
See 3/14 encounter:  Patient's daughter states the FMLA paperwork was not received. She is requesting to have it sent to [phone].

## 2022-06-05 NOTE — Telephone Encounter (Signed)
Unable to get note faxed to number given - retried today and it went through to [phone]

## 2022-06-06 ENCOUNTER — Other Ambulatory Visit: Payer: Self-pay | Admitting: Cardiology

## 2022-06-06 NOTE — Telephone Encounter (Signed)
Pt last saw Dr Agustin Cree 05/23/22, last labs 05/06/22 Creat 0.87 at [facility] per [place], age 82, weight 54.9kg, based on specified criteria pt is on appropriate dosage of Eliquis 2.5mg  BID for afib.  Will refill rx.

## 2022-06-11 DIAGNOSIS — Z5321 Procedure and treatment not carried out due to patient leaving prior to being seen by health care provider: Secondary | ICD-10-CM | POA: Diagnosis not present

## 2022-06-11 DIAGNOSIS — M79606 Pain in leg, unspecified: Secondary | ICD-10-CM | POA: Diagnosis not present

## 2022-06-12 ENCOUNTER — Telehealth: Payer: Self-pay

## 2022-06-12 NOTE — Telephone Encounter (Signed)
     Patient  visit on 3/26  at [facility]   Have you been able to follow up with your primary care physician? Yes   The patient was or was not able to obtain any needed medicine or equipment. Yes   Are there diet recommendations that you are having difficulty following? Na   Patient expresses understanding of discharge instructions and education provided has no other needs at this time.  Yes     [employer] [phone] [address], [place], [place] [address] Phone: [phone] Email: [email or web address]

## 2022-06-24 DIAGNOSIS — L03115 Cellulitis of right lower limb: Secondary | ICD-10-CM | POA: Diagnosis not present

## 2022-06-24 DIAGNOSIS — S80921A Unspecified superficial injury of right lower leg, initial encounter: Secondary | ICD-10-CM | POA: Diagnosis not present

## 2022-07-01 DIAGNOSIS — S80921D Unspecified superficial injury of right lower leg, subsequent encounter: Secondary | ICD-10-CM | POA: Diagnosis not present

## 2022-07-01 DIAGNOSIS — L03115 Cellulitis of right lower limb: Secondary | ICD-10-CM | POA: Diagnosis not present

## 2022-09-05 DIAGNOSIS — J449 Chronic obstructive pulmonary disease, unspecified: Secondary | ICD-10-CM | POA: Diagnosis not present

## 2022-09-05 DIAGNOSIS — E785 Hyperlipidemia, unspecified: Secondary | ICD-10-CM | POA: Diagnosis not present

## 2022-09-05 DIAGNOSIS — D692 Other nonthrombocytopenic purpura: Secondary | ICD-10-CM | POA: Diagnosis not present

## 2022-09-05 DIAGNOSIS — E039 Hypothyroidism, unspecified: Secondary | ICD-10-CM | POA: Diagnosis not present

## 2022-09-05 DIAGNOSIS — F09 Unspecified mental disorder due to known physiological condition: Secondary | ICD-10-CM | POA: Diagnosis not present

## 2022-09-05 DIAGNOSIS — I10 Essential (primary) hypertension: Secondary | ICD-10-CM | POA: Diagnosis not present

## 2022-09-05 DIAGNOSIS — E538 Deficiency of other specified B group vitamins: Secondary | ICD-10-CM | POA: Diagnosis not present

## 2022-09-05 DIAGNOSIS — I739 Peripheral vascular disease, unspecified: Secondary | ICD-10-CM | POA: Diagnosis not present

## 2022-09-05 DIAGNOSIS — I482 Chronic atrial fibrillation, unspecified: Secondary | ICD-10-CM | POA: Diagnosis not present

## 2022-10-26 DIAGNOSIS — Z9010 Acquired absence of unspecified breast and nipple: Secondary | ICD-10-CM | POA: Diagnosis not present

## 2022-10-26 DIAGNOSIS — R079 Chest pain, unspecified: Secondary | ICD-10-CM | POA: Diagnosis not present

## 2022-10-26 DIAGNOSIS — I10 Essential (primary) hypertension: Secondary | ICD-10-CM | POA: Diagnosis not present

## 2022-10-26 DIAGNOSIS — J449 Chronic obstructive pulmonary disease, unspecified: Secondary | ICD-10-CM | POA: Diagnosis not present

## 2022-10-26 DIAGNOSIS — Z1152 Encounter for screening for COVID-19: Secondary | ICD-10-CM | POA: Diagnosis not present

## 2022-10-26 DIAGNOSIS — Z4682 Encounter for fitting and adjustment of non-vascular catheter: Secondary | ICD-10-CM | POA: Diagnosis not present

## 2022-10-26 DIAGNOSIS — I700 Atherosclerosis of aorta: Secondary | ICD-10-CM | POA: Diagnosis not present

## 2022-10-26 DIAGNOSIS — I6523 Occlusion and stenosis of bilateral carotid arteries: Secondary | ICD-10-CM | POA: Diagnosis not present

## 2022-10-26 DIAGNOSIS — R519 Headache, unspecified: Secondary | ICD-10-CM | POA: Diagnosis not present

## 2022-11-01 DIAGNOSIS — I10 Essential (primary) hypertension: Secondary | ICD-10-CM | POA: Diagnosis not present

## 2022-11-01 DIAGNOSIS — I482 Chronic atrial fibrillation, unspecified: Secondary | ICD-10-CM | POA: Diagnosis not present

## 2022-11-01 DIAGNOSIS — R519 Headache, unspecified: Secondary | ICD-10-CM | POA: Diagnosis not present

## 2022-11-01 DIAGNOSIS — Z9181 History of falling: Secondary | ICD-10-CM | POA: Diagnosis not present

## 2022-11-04 ENCOUNTER — Telehealth: Payer: Self-pay

## 2022-11-04 NOTE — Telephone Encounter (Signed)
Transition Care Management Unsuccessful Follow-up Telephone Call  Date of discharge and from where:  10/27/2022 [facility]  Attempts:  1st Attempt  Reason for unsuccessful TCM follow-up call:  No answer/busy  Manny Vitolo [facility]  [facility] Population Health Community Resource Care Guide   [email or web address]  ?? [phone]   Website: [email or web address]  [email or web address]

## 2022-11-04 NOTE — Telephone Encounter (Signed)
Transition Care Management Unsuccessful Follow-up Telephone Call  Date of discharge and from where:  10/27/2022 [facility]  Attempts:  2nd Attempt  Reason for unsuccessful TCM follow-up call:  Left voice message  Johnnye Sandford [facility]  [facility] Population Health Community Resource Care Guide   [email or web address]  ?? [phone]   Website: [email or web address]  [email or web address]

## 2022-11-22 DIAGNOSIS — G43009 Migraine without aura, not intractable, without status migrainosus: Secondary | ICD-10-CM | POA: Diagnosis not present

## 2023-01-12 ENCOUNTER — Other Ambulatory Visit: Payer: Self-pay | Admitting: Cardiology

## 2023-01-13 DIAGNOSIS — I10 Essential (primary) hypertension: Secondary | ICD-10-CM | POA: Diagnosis not present

## 2023-01-13 DIAGNOSIS — J449 Chronic obstructive pulmonary disease, unspecified: Secondary | ICD-10-CM | POA: Diagnosis not present

## 2023-01-13 DIAGNOSIS — I739 Peripheral vascular disease, unspecified: Secondary | ICD-10-CM | POA: Diagnosis not present

## 2023-01-13 DIAGNOSIS — F09 Unspecified mental disorder due to known physiological condition: Secondary | ICD-10-CM | POA: Diagnosis not present

## 2023-01-13 DIAGNOSIS — E538 Deficiency of other specified B group vitamins: Secondary | ICD-10-CM | POA: Diagnosis not present

## 2023-01-13 DIAGNOSIS — E785 Hyperlipidemia, unspecified: Secondary | ICD-10-CM | POA: Diagnosis not present

## 2023-01-13 DIAGNOSIS — E039 Hypothyroidism, unspecified: Secondary | ICD-10-CM | POA: Diagnosis not present

## 2023-01-13 DIAGNOSIS — I482 Chronic atrial fibrillation, unspecified: Secondary | ICD-10-CM | POA: Diagnosis not present

## 2023-01-13 DIAGNOSIS — Z23 Encounter for immunization: Secondary | ICD-10-CM | POA: Diagnosis not present

## 2023-01-13 NOTE — Telephone Encounter (Signed)
Prescription refill request for Eliquis received. Indication:afib Last office visit:3/24 Scr:0.7  6/24 Age: 82 Weight:54.9  kg  Prescription refilled

## 2023-02-03 DIAGNOSIS — K529 Noninfective gastroenteritis and colitis, unspecified: Secondary | ICD-10-CM | POA: Diagnosis not present

## 2023-02-04 DIAGNOSIS — K529 Noninfective gastroenteritis and colitis, unspecified: Secondary | ICD-10-CM | POA: Diagnosis not present

## 2023-02-11 DIAGNOSIS — F419 Anxiety disorder, unspecified: Secondary | ICD-10-CM | POA: Diagnosis not present

## 2023-02-11 DIAGNOSIS — I739 Peripheral vascular disease, unspecified: Secondary | ICD-10-CM | POA: Diagnosis not present

## 2023-02-11 DIAGNOSIS — R109 Unspecified abdominal pain: Secondary | ICD-10-CM | POA: Diagnosis not present

## 2023-02-11 DIAGNOSIS — R9389 Abnormal findings on diagnostic imaging of other specified body structures: Secondary | ICD-10-CM | POA: Diagnosis not present

## 2023-02-20 DIAGNOSIS — I10 Essential (primary) hypertension: Secondary | ICD-10-CM | POA: Diagnosis not present

## 2023-02-20 DIAGNOSIS — S098XXA Other specified injuries of head, initial encounter: Secondary | ICD-10-CM | POA: Diagnosis not present

## 2023-02-20 DIAGNOSIS — F419 Anxiety disorder, unspecified: Secondary | ICD-10-CM | POA: Diagnosis not present

## 2023-02-20 DIAGNOSIS — M545 Low back pain, unspecified: Secondary | ICD-10-CM | POA: Diagnosis not present

## 2023-02-20 DIAGNOSIS — F09 Unspecified mental disorder due to known physiological condition: Secondary | ICD-10-CM | POA: Diagnosis not present

## 2023-02-21 DIAGNOSIS — S098XXA Other specified injuries of head, initial encounter: Secondary | ICD-10-CM | POA: Diagnosis not present

## 2023-02-21 DIAGNOSIS — M47816 Spondylosis without myelopathy or radiculopathy, lumbar region: Secondary | ICD-10-CM | POA: Diagnosis not present

## 2023-03-06 DIAGNOSIS — G9340 Encephalopathy, unspecified: Secondary | ICD-10-CM | POA: Diagnosis not present

## 2023-03-06 DIAGNOSIS — F02818 Dementia in other diseases classified elsewhere, unspecified severity, with other behavioral disturbance: Secondary | ICD-10-CM | POA: Diagnosis not present

## 2023-03-06 DIAGNOSIS — E785 Hyperlipidemia, unspecified: Secondary | ICD-10-CM | POA: Diagnosis not present

## 2023-03-06 DIAGNOSIS — K219 Gastro-esophageal reflux disease without esophagitis: Secondary | ICD-10-CM | POA: Diagnosis not present

## 2023-03-06 DIAGNOSIS — I10 Essential (primary) hypertension: Secondary | ICD-10-CM | POA: Diagnosis not present

## 2023-03-06 DIAGNOSIS — R918 Other nonspecific abnormal finding of lung field: Secondary | ICD-10-CM | POA: Diagnosis not present

## 2023-03-06 DIAGNOSIS — R0602 Shortness of breath: Secondary | ICD-10-CM | POA: Diagnosis not present

## 2023-03-06 DIAGNOSIS — F03918 Unspecified dementia, unspecified severity, with other behavioral disturbance: Secondary | ICD-10-CM | POA: Diagnosis not present

## 2023-03-06 DIAGNOSIS — E039 Hypothyroidism, unspecified: Secondary | ICD-10-CM | POA: Diagnosis not present

## 2023-03-06 DIAGNOSIS — R9431 Abnormal electrocardiogram [ECG] [EKG]: Secondary | ICD-10-CM | POA: Diagnosis not present

## 2023-03-06 DIAGNOSIS — F03911 Unspecified dementia, unspecified severity, with agitation: Secondary | ICD-10-CM | POA: Diagnosis not present

## 2023-03-06 DIAGNOSIS — N390 Urinary tract infection, site not specified: Secondary | ICD-10-CM | POA: Diagnosis not present

## 2023-03-06 DIAGNOSIS — F02811 Dementia in other diseases classified elsewhere, unspecified severity, with agitation: Secondary | ICD-10-CM | POA: Diagnosis not present

## 2023-03-07 DIAGNOSIS — F02818 Dementia in other diseases classified elsewhere, unspecified severity, with other behavioral disturbance: Secondary | ICD-10-CM | POA: Diagnosis not present

## 2023-03-07 DIAGNOSIS — Z09 Encounter for follow-up examination after completed treatment for conditions other than malignant neoplasm: Secondary | ICD-10-CM | POA: Diagnosis not present

## 2023-03-07 DIAGNOSIS — Z8673 Personal history of transient ischemic attack (TIA), and cerebral infarction without residual deficits: Secondary | ICD-10-CM | POA: Diagnosis not present

## 2023-03-08 DIAGNOSIS — F02818 Dementia in other diseases classified elsewhere, unspecified severity, with other behavioral disturbance: Secondary | ICD-10-CM | POA: Diagnosis not present

## 2023-03-08 DIAGNOSIS — Z8673 Personal history of transient ischemic attack (TIA), and cerebral infarction without residual deficits: Secondary | ICD-10-CM | POA: Diagnosis not present

## 2023-03-09 DIAGNOSIS — Z8673 Personal history of transient ischemic attack (TIA), and cerebral infarction without residual deficits: Secondary | ICD-10-CM | POA: Diagnosis not present

## 2023-03-09 DIAGNOSIS — Z09 Encounter for follow-up examination after completed treatment for conditions other than malignant neoplasm: Secondary | ICD-10-CM | POA: Diagnosis not present

## 2023-03-09 DIAGNOSIS — F02818 Dementia in other diseases classified elsewhere, unspecified severity, with other behavioral disturbance: Secondary | ICD-10-CM | POA: Diagnosis not present

## 2023-03-10 DIAGNOSIS — F02818 Dementia in other diseases classified elsewhere, unspecified severity, with other behavioral disturbance: Secondary | ICD-10-CM | POA: Diagnosis not present

## 2023-03-10 DIAGNOSIS — Z8673 Personal history of transient ischemic attack (TIA), and cerebral infarction without residual deficits: Secondary | ICD-10-CM | POA: Diagnosis not present

## 2023-03-10 DIAGNOSIS — F03918 Unspecified dementia, unspecified severity, with other behavioral disturbance: Secondary | ICD-10-CM | POA: Diagnosis not present

## 2023-03-11 DIAGNOSIS — F03918 Unspecified dementia, unspecified severity, with other behavioral disturbance: Secondary | ICD-10-CM | POA: Diagnosis not present

## 2023-03-11 DIAGNOSIS — F02818 Dementia in other diseases classified elsewhere, unspecified severity, with other behavioral disturbance: Secondary | ICD-10-CM | POA: Diagnosis not present

## 2023-03-12 ENCOUNTER — Other Ambulatory Visit: Payer: Self-pay | Admitting: Cardiology

## 2023-03-12 DIAGNOSIS — F02818 Dementia in other diseases classified elsewhere, unspecified severity, with other behavioral disturbance: Secondary | ICD-10-CM | POA: Diagnosis not present

## 2023-03-12 DIAGNOSIS — F03918 Unspecified dementia, unspecified severity, with other behavioral disturbance: Secondary | ICD-10-CM | POA: Diagnosis not present

## 2023-03-14 DIAGNOSIS — F03918 Unspecified dementia, unspecified severity, with other behavioral disturbance: Secondary | ICD-10-CM | POA: Diagnosis not present

## 2023-03-14 DIAGNOSIS — F02818 Dementia in other diseases classified elsewhere, unspecified severity, with other behavioral disturbance: Secondary | ICD-10-CM | POA: Diagnosis not present

## 2023-03-15 DIAGNOSIS — F02818 Dementia in other diseases classified elsewhere, unspecified severity, with other behavioral disturbance: Secondary | ICD-10-CM | POA: Diagnosis not present

## 2023-03-15 DIAGNOSIS — F03918 Unspecified dementia, unspecified severity, with other behavioral disturbance: Secondary | ICD-10-CM | POA: Diagnosis not present

## 2023-03-16 DIAGNOSIS — F02818 Dementia in other diseases classified elsewhere, unspecified severity, with other behavioral disturbance: Secondary | ICD-10-CM | POA: Diagnosis not present

## 2023-03-16 DIAGNOSIS — F03918 Unspecified dementia, unspecified severity, with other behavioral disturbance: Secondary | ICD-10-CM | POA: Diagnosis not present

## 2023-03-17 DIAGNOSIS — F03918 Unspecified dementia, unspecified severity, with other behavioral disturbance: Secondary | ICD-10-CM | POA: Diagnosis not present

## 2023-03-17 DIAGNOSIS — F02818 Dementia in other diseases classified elsewhere, unspecified severity, with other behavioral disturbance: Secondary | ICD-10-CM | POA: Diagnosis not present

## 2023-03-20 DIAGNOSIS — Z79899 Other long term (current) drug therapy: Secondary | ICD-10-CM | POA: Diagnosis not present

## 2023-03-20 DIAGNOSIS — F09 Unspecified mental disorder due to known physiological condition: Secondary | ICD-10-CM | POA: Diagnosis not present

## 2023-03-20 DIAGNOSIS — E039 Hypothyroidism, unspecified: Secondary | ICD-10-CM | POA: Diagnosis not present

## 2023-03-20 DIAGNOSIS — N390 Urinary tract infection, site not specified: Secondary | ICD-10-CM | POA: Diagnosis not present

## 2023-03-20 DIAGNOSIS — I482 Chronic atrial fibrillation, unspecified: Secondary | ICD-10-CM | POA: Diagnosis not present

## 2023-03-20 DIAGNOSIS — J449 Chronic obstructive pulmonary disease, unspecified: Secondary | ICD-10-CM | POA: Diagnosis not present

## 2023-03-20 DIAGNOSIS — F419 Anxiety disorder, unspecified: Secondary | ICD-10-CM | POA: Diagnosis not present

## 2023-03-22 DIAGNOSIS — I4891 Unspecified atrial fibrillation: Secondary | ICD-10-CM | POA: Diagnosis not present

## 2023-03-22 DIAGNOSIS — U071 COVID-19: Secondary | ICD-10-CM | POA: Diagnosis not present

## 2023-03-22 DIAGNOSIS — F02818 Dementia in other diseases classified elsewhere, unspecified severity, with other behavioral disturbance: Secondary | ICD-10-CM | POA: Diagnosis not present

## 2023-03-22 DIAGNOSIS — J449 Chronic obstructive pulmonary disease, unspecified: Secondary | ICD-10-CM | POA: Diagnosis not present

## 2023-03-22 DIAGNOSIS — R627 Adult failure to thrive: Secondary | ICD-10-CM | POA: Diagnosis not present

## 2023-03-22 DIAGNOSIS — Z66 Do not resuscitate: Secondary | ICD-10-CM | POA: Diagnosis not present

## 2023-03-22 DIAGNOSIS — I10 Essential (primary) hypertension: Secondary | ICD-10-CM | POA: Diagnosis not present

## 2023-03-22 DIAGNOSIS — R918 Other nonspecific abnormal finding of lung field: Secondary | ICD-10-CM | POA: Diagnosis not present

## 2023-03-22 DIAGNOSIS — F01518 Vascular dementia, unspecified severity, with other behavioral disturbance: Secondary | ICD-10-CM | POA: Diagnosis not present

## 2023-03-22 DIAGNOSIS — E860 Dehydration: Secondary | ICD-10-CM | POA: Diagnosis not present

## 2023-03-24 DIAGNOSIS — I454 Nonspecific intraventricular block: Secondary | ICD-10-CM | POA: Diagnosis not present

## 2023-03-24 DIAGNOSIS — I4891 Unspecified atrial fibrillation: Secondary | ICD-10-CM | POA: Diagnosis not present

## 2023-03-25 DIAGNOSIS — F01518 Vascular dementia, unspecified severity, with other behavioral disturbance: Secondary | ICD-10-CM | POA: Diagnosis not present

## 2023-03-25 DIAGNOSIS — I4891 Unspecified atrial fibrillation: Secondary | ICD-10-CM | POA: Diagnosis not present

## 2023-03-25 DIAGNOSIS — F02818 Dementia in other diseases classified elsewhere, unspecified severity, with other behavioral disturbance: Secondary | ICD-10-CM | POA: Diagnosis not present

## 2023-03-25 DIAGNOSIS — G309 Alzheimer's disease, unspecified: Secondary | ICD-10-CM | POA: Diagnosis not present

## 2023-03-25 DIAGNOSIS — I10 Essential (primary) hypertension: Secondary | ICD-10-CM | POA: Diagnosis not present

## 2023-03-25 DIAGNOSIS — M1990 Unspecified osteoarthritis, unspecified site: Secondary | ICD-10-CM | POA: Diagnosis not present

## 2023-03-25 DIAGNOSIS — U071 COVID-19: Secondary | ICD-10-CM | POA: Diagnosis not present

## 2023-03-25 DIAGNOSIS — F05 Delirium due to known physiological condition: Secondary | ICD-10-CM | POA: Diagnosis not present

## 2023-03-25 DIAGNOSIS — J449 Chronic obstructive pulmonary disease, unspecified: Secondary | ICD-10-CM | POA: Diagnosis not present

## 2023-03-31 DIAGNOSIS — F419 Anxiety disorder, unspecified: Secondary | ICD-10-CM | POA: Diagnosis not present

## 2023-03-31 DIAGNOSIS — I959 Hypotension, unspecified: Secondary | ICD-10-CM | POA: Diagnosis not present

## 2023-03-31 DIAGNOSIS — F05 Delirium due to known physiological condition: Secondary | ICD-10-CM | POA: Diagnosis not present

## 2023-03-31 DIAGNOSIS — R399 Unspecified symptoms and signs involving the genitourinary system: Secondary | ICD-10-CM | POA: Diagnosis not present

## 2023-03-31 DIAGNOSIS — J449 Chronic obstructive pulmonary disease, unspecified: Secondary | ICD-10-CM | POA: Diagnosis not present

## 2023-03-31 DIAGNOSIS — I482 Chronic atrial fibrillation, unspecified: Secondary | ICD-10-CM | POA: Diagnosis not present

## 2023-03-31 DIAGNOSIS — M1990 Unspecified osteoarthritis, unspecified site: Secondary | ICD-10-CM | POA: Diagnosis not present

## 2023-03-31 DIAGNOSIS — F02818 Dementia in other diseases classified elsewhere, unspecified severity, with other behavioral disturbance: Secondary | ICD-10-CM | POA: Diagnosis not present

## 2023-03-31 DIAGNOSIS — I4891 Unspecified atrial fibrillation: Secondary | ICD-10-CM | POA: Diagnosis not present

## 2023-03-31 DIAGNOSIS — Z6821 Body mass index (BMI) 21.0-21.9, adult: Secondary | ICD-10-CM | POA: Diagnosis not present

## 2023-03-31 DIAGNOSIS — Z79899 Other long term (current) drug therapy: Secondary | ICD-10-CM | POA: Diagnosis not present

## 2023-03-31 DIAGNOSIS — I10 Essential (primary) hypertension: Secondary | ICD-10-CM | POA: Diagnosis not present

## 2023-03-31 DIAGNOSIS — F01518 Vascular dementia, unspecified severity, with other behavioral disturbance: Secondary | ICD-10-CM | POA: Diagnosis not present

## 2023-03-31 DIAGNOSIS — G309 Alzheimer's disease, unspecified: Secondary | ICD-10-CM | POA: Diagnosis not present

## 2023-03-31 DIAGNOSIS — F09 Unspecified mental disorder due to known physiological condition: Secondary | ICD-10-CM | POA: Diagnosis not present

## 2023-03-31 DIAGNOSIS — U071 COVID-19: Secondary | ICD-10-CM | POA: Diagnosis not present

## 2023-04-10 DIAGNOSIS — I959 Hypotension, unspecified: Secondary | ICD-10-CM | POA: Diagnosis not present

## 2023-04-10 DIAGNOSIS — N390 Urinary tract infection, site not specified: Secondary | ICD-10-CM | POA: Diagnosis not present

## 2023-04-10 DIAGNOSIS — F419 Anxiety disorder, unspecified: Secondary | ICD-10-CM | POA: Diagnosis not present

## 2023-04-10 DIAGNOSIS — F09 Unspecified mental disorder due to known physiological condition: Secondary | ICD-10-CM | POA: Diagnosis not present

## 2023-04-11 DIAGNOSIS — I4891 Unspecified atrial fibrillation: Secondary | ICD-10-CM | POA: Diagnosis not present

## 2023-04-11 DIAGNOSIS — G309 Alzheimer's disease, unspecified: Secondary | ICD-10-CM | POA: Diagnosis not present

## 2023-04-11 DIAGNOSIS — J449 Chronic obstructive pulmonary disease, unspecified: Secondary | ICD-10-CM | POA: Diagnosis not present

## 2023-04-11 DIAGNOSIS — F01518 Vascular dementia, unspecified severity, with other behavioral disturbance: Secondary | ICD-10-CM | POA: Diagnosis not present

## 2023-04-11 DIAGNOSIS — U071 COVID-19: Secondary | ICD-10-CM | POA: Diagnosis not present

## 2023-04-11 DIAGNOSIS — F05 Delirium due to known physiological condition: Secondary | ICD-10-CM | POA: Diagnosis not present

## 2023-04-11 DIAGNOSIS — M1990 Unspecified osteoarthritis, unspecified site: Secondary | ICD-10-CM | POA: Diagnosis not present

## 2023-04-11 DIAGNOSIS — I10 Essential (primary) hypertension: Secondary | ICD-10-CM | POA: Diagnosis not present

## 2023-04-11 DIAGNOSIS — F02818 Dementia in other diseases classified elsewhere, unspecified severity, with other behavioral disturbance: Secondary | ICD-10-CM | POA: Diagnosis not present

## 2023-04-15 DIAGNOSIS — F01518 Vascular dementia, unspecified severity, with other behavioral disturbance: Secondary | ICD-10-CM | POA: Diagnosis not present

## 2023-04-15 DIAGNOSIS — I10 Essential (primary) hypertension: Secondary | ICD-10-CM | POA: Diagnosis not present

## 2023-04-15 DIAGNOSIS — F02818 Dementia in other diseases classified elsewhere, unspecified severity, with other behavioral disturbance: Secondary | ICD-10-CM | POA: Diagnosis not present

## 2023-04-15 DIAGNOSIS — U071 COVID-19: Secondary | ICD-10-CM | POA: Diagnosis not present

## 2023-04-15 DIAGNOSIS — F05 Delirium due to known physiological condition: Secondary | ICD-10-CM | POA: Diagnosis not present

## 2023-04-15 DIAGNOSIS — G309 Alzheimer's disease, unspecified: Secondary | ICD-10-CM | POA: Diagnosis not present

## 2023-04-15 DIAGNOSIS — J449 Chronic obstructive pulmonary disease, unspecified: Secondary | ICD-10-CM | POA: Diagnosis not present

## 2023-04-15 DIAGNOSIS — I4891 Unspecified atrial fibrillation: Secondary | ICD-10-CM | POA: Diagnosis not present

## 2023-04-15 DIAGNOSIS — M1990 Unspecified osteoarthritis, unspecified site: Secondary | ICD-10-CM | POA: Diagnosis not present

## 2023-04-21 DIAGNOSIS — G309 Alzheimer's disease, unspecified: Secondary | ICD-10-CM | POA: Diagnosis not present

## 2023-04-21 DIAGNOSIS — F05 Delirium due to known physiological condition: Secondary | ICD-10-CM | POA: Diagnosis not present

## 2023-04-21 DIAGNOSIS — I10 Essential (primary) hypertension: Secondary | ICD-10-CM | POA: Diagnosis not present

## 2023-04-21 DIAGNOSIS — F01518 Vascular dementia, unspecified severity, with other behavioral disturbance: Secondary | ICD-10-CM | POA: Diagnosis not present

## 2023-04-21 DIAGNOSIS — I4891 Unspecified atrial fibrillation: Secondary | ICD-10-CM | POA: Diagnosis not present

## 2023-04-21 DIAGNOSIS — M1990 Unspecified osteoarthritis, unspecified site: Secondary | ICD-10-CM | POA: Diagnosis not present

## 2023-04-21 DIAGNOSIS — F02818 Dementia in other diseases classified elsewhere, unspecified severity, with other behavioral disturbance: Secondary | ICD-10-CM | POA: Diagnosis not present

## 2023-04-21 DIAGNOSIS — J449 Chronic obstructive pulmonary disease, unspecified: Secondary | ICD-10-CM | POA: Diagnosis not present

## 2023-04-21 DIAGNOSIS — U071 COVID-19: Secondary | ICD-10-CM | POA: Diagnosis not present

## 2023-04-24 DIAGNOSIS — N390 Urinary tract infection, site not specified: Secondary | ICD-10-CM | POA: Diagnosis not present

## 2023-04-24 DIAGNOSIS — Z6820 Body mass index (BMI) 20.0-20.9, adult: Secondary | ICD-10-CM | POA: Diagnosis not present

## 2023-04-24 DIAGNOSIS — F09 Unspecified mental disorder due to known physiological condition: Secondary | ICD-10-CM | POA: Diagnosis not present

## 2023-04-24 DIAGNOSIS — F01518 Vascular dementia, unspecified severity, with other behavioral disturbance: Secondary | ICD-10-CM | POA: Diagnosis not present

## 2023-05-09 DIAGNOSIS — E538 Deficiency of other specified B group vitamins: Secondary | ICD-10-CM | POA: Diagnosis not present

## 2023-05-09 DIAGNOSIS — G43009 Migraine without aura, not intractable, without status migrainosus: Secondary | ICD-10-CM | POA: Diagnosis not present

## 2023-05-09 DIAGNOSIS — F0390 Unspecified dementia without behavioral disturbance: Secondary | ICD-10-CM | POA: Diagnosis not present

## 2023-05-12 DIAGNOSIS — Z7189 Other specified counseling: Secondary | ICD-10-CM | POA: Diagnosis not present

## 2023-05-12 DIAGNOSIS — R519 Headache, unspecified: Secondary | ICD-10-CM | POA: Diagnosis not present

## 2023-05-12 DIAGNOSIS — F0390 Unspecified dementia without behavioral disturbance: Secondary | ICD-10-CM | POA: Diagnosis not present

## 2023-05-12 DIAGNOSIS — N390 Urinary tract infection, site not specified: Secondary | ICD-10-CM | POA: Diagnosis not present

## 2023-05-12 DIAGNOSIS — F01518 Vascular dementia, unspecified severity, with other behavioral disturbance: Secondary | ICD-10-CM | POA: Diagnosis not present

## 2023-05-12 DIAGNOSIS — F419 Anxiety disorder, unspecified: Secondary | ICD-10-CM | POA: Diagnosis not present

## 2023-05-12 DIAGNOSIS — K5909 Other constipation: Secondary | ICD-10-CM | POA: Diagnosis not present

## 2023-05-12 DIAGNOSIS — I10 Essential (primary) hypertension: Secondary | ICD-10-CM | POA: Diagnosis not present

## 2023-05-25 ENCOUNTER — Other Ambulatory Visit: Payer: Self-pay | Admitting: Cardiology

## 2023-05-30 DIAGNOSIS — R300 Dysuria: Secondary | ICD-10-CM | POA: Diagnosis not present

## 2023-06-02 DIAGNOSIS — I4891 Unspecified atrial fibrillation: Secondary | ICD-10-CM | POA: Diagnosis not present

## 2023-06-02 DIAGNOSIS — J189 Pneumonia, unspecified organism: Secondary | ICD-10-CM | POA: Diagnosis not present

## 2023-06-02 DIAGNOSIS — R6521 Severe sepsis with septic shock: Secondary | ICD-10-CM | POA: Diagnosis not present

## 2023-06-02 DIAGNOSIS — I081 Rheumatic disorders of both mitral and tricuspid valves: Secondary | ICD-10-CM | POA: Diagnosis not present

## 2023-06-02 DIAGNOSIS — Z20822 Contact with and (suspected) exposure to covid-19: Secondary | ICD-10-CM | POA: Diagnosis not present

## 2023-06-02 DIAGNOSIS — Z66 Do not resuscitate: Secondary | ICD-10-CM | POA: Diagnosis not present

## 2023-06-02 DIAGNOSIS — R6520 Severe sepsis without septic shock: Secondary | ICD-10-CM | POA: Diagnosis not present

## 2023-06-02 DIAGNOSIS — N939 Abnormal uterine and vaginal bleeding, unspecified: Secondary | ICD-10-CM | POA: Diagnosis not present

## 2023-06-02 DIAGNOSIS — D696 Thrombocytopenia, unspecified: Secondary | ICD-10-CM | POA: Diagnosis not present

## 2023-06-02 DIAGNOSIS — F01518 Vascular dementia, unspecified severity, with other behavioral disturbance: Secondary | ICD-10-CM | POA: Diagnosis not present

## 2023-06-02 DIAGNOSIS — A419 Sepsis, unspecified organism: Secondary | ICD-10-CM | POA: Diagnosis not present

## 2023-06-02 DIAGNOSIS — R112 Nausea with vomiting, unspecified: Secondary | ICD-10-CM | POA: Diagnosis not present

## 2023-06-02 DIAGNOSIS — G309 Alzheimer's disease, unspecified: Secondary | ICD-10-CM | POA: Diagnosis not present

## 2023-06-02 DIAGNOSIS — J9601 Acute respiratory failure with hypoxia: Secondary | ICD-10-CM | POA: Diagnosis not present

## 2023-06-02 DIAGNOSIS — J9811 Atelectasis: Secondary | ICD-10-CM | POA: Diagnosis not present

## 2023-06-02 DIAGNOSIS — I2109 ST elevation (STEMI) myocardial infarction involving other coronary artery of anterior wall: Secondary | ICD-10-CM | POA: Diagnosis not present

## 2023-06-02 DIAGNOSIS — R062 Wheezing: Secondary | ICD-10-CM | POA: Diagnosis not present

## 2023-06-02 DIAGNOSIS — R140 Abdominal distension (gaseous): Secondary | ICD-10-CM | POA: Diagnosis not present

## 2023-06-02 DIAGNOSIS — I4821 Permanent atrial fibrillation: Secondary | ICD-10-CM | POA: Diagnosis not present

## 2023-06-11 DIAGNOSIS — F39 Unspecified mood [affective] disorder: Secondary | ICD-10-CM | POA: Diagnosis not present

## 2023-06-11 DIAGNOSIS — I6900 Unspecified sequelae of nontraumatic subarachnoid hemorrhage: Secondary | ICD-10-CM | POA: Diagnosis not present

## 2023-06-11 DIAGNOSIS — F419 Anxiety disorder, unspecified: Secondary | ICD-10-CM | POA: Diagnosis not present

## 2023-06-11 DIAGNOSIS — F0393 Unspecified dementia, unspecified severity, with mood disturbance: Secondary | ICD-10-CM | POA: Diagnosis not present

## 2023-06-19 DIAGNOSIS — M810 Age-related osteoporosis without current pathological fracture: Secondary | ICD-10-CM | POA: Diagnosis not present

## 2023-06-19 DIAGNOSIS — J449 Chronic obstructive pulmonary disease, unspecified: Secondary | ICD-10-CM | POA: Diagnosis not present

## 2023-06-19 DIAGNOSIS — E039 Hypothyroidism, unspecified: Secondary | ICD-10-CM | POA: Diagnosis not present

## 2023-06-19 DIAGNOSIS — K219 Gastro-esophageal reflux disease without esophagitis: Secondary | ICD-10-CM | POA: Diagnosis not present

## 2023-06-24 DIAGNOSIS — H9193 Unspecified hearing loss, bilateral: Secondary | ICD-10-CM | POA: Diagnosis not present

## 2023-06-24 DIAGNOSIS — F0154 Vascular dementia, unspecified severity, with anxiety: Secondary | ICD-10-CM | POA: Diagnosis not present

## 2023-06-24 DIAGNOSIS — G43909 Migraine, unspecified, not intractable, without status migrainosus: Secondary | ICD-10-CM | POA: Diagnosis not present

## 2023-06-24 DIAGNOSIS — J449 Chronic obstructive pulmonary disease, unspecified: Secondary | ICD-10-CM | POA: Diagnosis not present

## 2023-06-24 DIAGNOSIS — K219 Gastro-esophageal reflux disease without esophagitis: Secondary | ICD-10-CM | POA: Diagnosis not present

## 2023-06-24 DIAGNOSIS — I4891 Unspecified atrial fibrillation: Secondary | ICD-10-CM | POA: Diagnosis not present

## 2023-06-24 DIAGNOSIS — M810 Age-related osteoporosis without current pathological fracture: Secondary | ICD-10-CM | POA: Diagnosis not present

## 2023-06-24 DIAGNOSIS — E039 Hypothyroidism, unspecified: Secondary | ICD-10-CM | POA: Diagnosis not present

## 2023-06-24 DIAGNOSIS — I739 Peripheral vascular disease, unspecified: Secondary | ICD-10-CM | POA: Diagnosis not present

## 2023-06-25 DIAGNOSIS — I4891 Unspecified atrial fibrillation: Secondary | ICD-10-CM | POA: Diagnosis not present

## 2023-06-25 DIAGNOSIS — J449 Chronic obstructive pulmonary disease, unspecified: Secondary | ICD-10-CM | POA: Diagnosis not present

## 2023-06-25 DIAGNOSIS — H9193 Unspecified hearing loss, bilateral: Secondary | ICD-10-CM | POA: Diagnosis not present

## 2023-06-25 DIAGNOSIS — K219 Gastro-esophageal reflux disease without esophagitis: Secondary | ICD-10-CM | POA: Diagnosis not present

## 2023-06-25 DIAGNOSIS — G43909 Migraine, unspecified, not intractable, without status migrainosus: Secondary | ICD-10-CM | POA: Diagnosis not present

## 2023-06-25 DIAGNOSIS — E039 Hypothyroidism, unspecified: Secondary | ICD-10-CM | POA: Diagnosis not present

## 2023-06-25 DIAGNOSIS — F0154 Vascular dementia, unspecified severity, with anxiety: Secondary | ICD-10-CM | POA: Diagnosis not present

## 2023-06-25 DIAGNOSIS — M810 Age-related osteoporosis without current pathological fracture: Secondary | ICD-10-CM | POA: Diagnosis not present

## 2023-06-25 DIAGNOSIS — I739 Peripheral vascular disease, unspecified: Secondary | ICD-10-CM | POA: Diagnosis not present

## 2023-06-27 DIAGNOSIS — F0154 Vascular dementia, unspecified severity, with anxiety: Secondary | ICD-10-CM | POA: Diagnosis not present

## 2023-06-27 DIAGNOSIS — K219 Gastro-esophageal reflux disease without esophagitis: Secondary | ICD-10-CM | POA: Diagnosis not present

## 2023-06-27 DIAGNOSIS — G43909 Migraine, unspecified, not intractable, without status migrainosus: Secondary | ICD-10-CM | POA: Diagnosis not present

## 2023-06-27 DIAGNOSIS — J449 Chronic obstructive pulmonary disease, unspecified: Secondary | ICD-10-CM | POA: Diagnosis not present

## 2023-06-27 DIAGNOSIS — M810 Age-related osteoporosis without current pathological fracture: Secondary | ICD-10-CM | POA: Diagnosis not present

## 2023-06-27 DIAGNOSIS — I739 Peripheral vascular disease, unspecified: Secondary | ICD-10-CM | POA: Diagnosis not present

## 2023-06-27 DIAGNOSIS — H9193 Unspecified hearing loss, bilateral: Secondary | ICD-10-CM | POA: Diagnosis not present

## 2023-06-27 DIAGNOSIS — E039 Hypothyroidism, unspecified: Secondary | ICD-10-CM | POA: Diagnosis not present

## 2023-06-27 DIAGNOSIS — I4891 Unspecified atrial fibrillation: Secondary | ICD-10-CM | POA: Diagnosis not present

## 2023-06-30 DIAGNOSIS — R404 Transient alteration of awareness: Secondary | ICD-10-CM | POA: Diagnosis not present

## 2023-06-30 DIAGNOSIS — J438 Other emphysema: Secondary | ICD-10-CM | POA: Diagnosis not present

## 2023-06-30 DIAGNOSIS — E8770 Fluid overload, unspecified: Secondary | ICD-10-CM | POA: Diagnosis not present

## 2023-06-30 DIAGNOSIS — J189 Pneumonia, unspecified organism: Secondary | ICD-10-CM | POA: Diagnosis not present

## 2023-06-30 DIAGNOSIS — I5021 Acute systolic (congestive) heart failure: Secondary | ICD-10-CM | POA: Diagnosis not present

## 2023-06-30 DIAGNOSIS — F03918 Unspecified dementia, unspecified severity, with other behavioral disturbance: Secondary | ICD-10-CM | POA: Diagnosis not present

## 2023-06-30 DIAGNOSIS — Z66 Do not resuscitate: Secondary | ICD-10-CM | POA: Diagnosis not present

## 2023-06-30 DIAGNOSIS — C3490 Malignant neoplasm of unspecified part of unspecified bronchus or lung: Secondary | ICD-10-CM | POA: Diagnosis not present

## 2023-06-30 DIAGNOSIS — I739 Peripheral vascular disease, unspecified: Secondary | ICD-10-CM | POA: Diagnosis not present

## 2023-06-30 DIAGNOSIS — Z743 Need for continuous supervision: Secondary | ICD-10-CM | POA: Diagnosis not present

## 2023-06-30 DIAGNOSIS — E872 Acidosis, unspecified: Secondary | ICD-10-CM | POA: Diagnosis not present

## 2023-06-30 DIAGNOSIS — F01518 Vascular dementia, unspecified severity, with other behavioral disturbance: Secondary | ICD-10-CM | POA: Diagnosis not present

## 2023-06-30 DIAGNOSIS — R419 Unspecified symptoms and signs involving cognitive functions and awareness: Secondary | ICD-10-CM | POA: Diagnosis not present

## 2023-06-30 DIAGNOSIS — I5023 Acute on chronic systolic (congestive) heart failure: Secondary | ICD-10-CM | POA: Diagnosis not present

## 2023-06-30 DIAGNOSIS — I70201 Unspecified atherosclerosis of native arteries of extremities, right leg: Secondary | ICD-10-CM | POA: Diagnosis not present

## 2023-06-30 DIAGNOSIS — I4891 Unspecified atrial fibrillation: Secondary | ICD-10-CM | POA: Diagnosis not present

## 2023-06-30 DIAGNOSIS — R230 Cyanosis: Secondary | ICD-10-CM | POA: Diagnosis not present

## 2023-06-30 DIAGNOSIS — J440 Chronic obstructive pulmonary disease with acute lower respiratory infection: Secondary | ICD-10-CM | POA: Diagnosis not present

## 2023-06-30 DIAGNOSIS — I2489 Other forms of acute ischemic heart disease: Secondary | ICD-10-CM | POA: Diagnosis not present

## 2023-06-30 DIAGNOSIS — Z950 Presence of cardiac pacemaker: Secondary | ICD-10-CM | POA: Diagnosis not present

## 2023-06-30 DIAGNOSIS — R0602 Shortness of breath: Secondary | ICD-10-CM | POA: Diagnosis not present

## 2023-06-30 DIAGNOSIS — R55 Syncope and collapse: Secondary | ICD-10-CM | POA: Diagnosis not present

## 2023-06-30 DIAGNOSIS — I4821 Permanent atrial fibrillation: Secondary | ICD-10-CM | POA: Diagnosis not present

## 2023-06-30 DIAGNOSIS — K219 Gastro-esophageal reflux disease without esophagitis: Secondary | ICD-10-CM | POA: Diagnosis not present

## 2023-06-30 DIAGNOSIS — I4819 Other persistent atrial fibrillation: Secondary | ICD-10-CM | POA: Diagnosis not present

## 2023-07-02 DIAGNOSIS — Z950 Presence of cardiac pacemaker: Secondary | ICD-10-CM | POA: Diagnosis not present

## 2023-07-02 DIAGNOSIS — F03918 Unspecified dementia, unspecified severity, with other behavioral disturbance: Secondary | ICD-10-CM | POA: Diagnosis not present

## 2023-07-02 DIAGNOSIS — I5021 Acute systolic (congestive) heart failure: Secondary | ICD-10-CM | POA: Diagnosis not present

## 2023-07-02 DIAGNOSIS — J189 Pneumonia, unspecified organism: Secondary | ICD-10-CM | POA: Diagnosis not present

## 2023-07-02 DIAGNOSIS — I739 Peripheral vascular disease, unspecified: Secondary | ICD-10-CM | POA: Diagnosis not present

## 2023-07-02 DIAGNOSIS — I4891 Unspecified atrial fibrillation: Secondary | ICD-10-CM | POA: Diagnosis not present

## 2023-07-02 DIAGNOSIS — I4819 Other persistent atrial fibrillation: Secondary | ICD-10-CM | POA: Diagnosis not present

## 2023-07-02 DIAGNOSIS — K219 Gastro-esophageal reflux disease without esophagitis: Secondary | ICD-10-CM | POA: Diagnosis not present

## 2023-07-02 DIAGNOSIS — R419 Unspecified symptoms and signs involving cognitive functions and awareness: Secondary | ICD-10-CM | POA: Diagnosis not present

## 2023-07-02 DIAGNOSIS — J438 Other emphysema: Secondary | ICD-10-CM | POA: Diagnosis not present

## 2023-07-03 DIAGNOSIS — R419 Unspecified symptoms and signs involving cognitive functions and awareness: Secondary | ICD-10-CM | POA: Diagnosis not present

## 2023-07-03 DIAGNOSIS — I4819 Other persistent atrial fibrillation: Secondary | ICD-10-CM | POA: Diagnosis not present

## 2023-07-03 DIAGNOSIS — F03918 Unspecified dementia, unspecified severity, with other behavioral disturbance: Secondary | ICD-10-CM | POA: Diagnosis not present

## 2023-07-03 DIAGNOSIS — I5021 Acute systolic (congestive) heart failure: Secondary | ICD-10-CM | POA: Diagnosis not present

## 2023-07-03 DIAGNOSIS — I739 Peripheral vascular disease, unspecified: Secondary | ICD-10-CM | POA: Diagnosis not present

## 2023-07-03 DIAGNOSIS — K219 Gastro-esophageal reflux disease without esophagitis: Secondary | ICD-10-CM | POA: Diagnosis not present

## 2023-07-09 DIAGNOSIS — J189 Pneumonia, unspecified organism: Secondary | ICD-10-CM | POA: Diagnosis not present

## 2023-07-24 DIAGNOSIS — B351 Tinea unguium: Secondary | ICD-10-CM | POA: Diagnosis not present

## 2023-07-24 DIAGNOSIS — M2011 Hallux valgus (acquired), right foot: Secondary | ICD-10-CM | POA: Diagnosis not present

## 2023-07-24 DIAGNOSIS — M79675 Pain in left toe(s): Secondary | ICD-10-CM | POA: Diagnosis not present

## 2023-07-24 DIAGNOSIS — I739 Peripheral vascular disease, unspecified: Secondary | ICD-10-CM | POA: Diagnosis not present

## 2023-08-17 DEATH — deceased
